# Patient Record
Sex: Female | Born: 1955 | Race: White | Hispanic: No | Marital: Single | Smoking: Never smoker
Health system: Southern US, Community
[De-identification: ages and names within clinical notes are randomized; demographics above are authoritative.]

## PROBLEM LIST (undated history)

## (undated) DIAGNOSIS — E7800 Pure hypercholesterolemia, unspecified: Secondary | ICD-10-CM

## (undated) DIAGNOSIS — Z803 Family history of malignant neoplasm of breast: Secondary | ICD-10-CM

## (undated) DIAGNOSIS — Z800 Family history of malignant neoplasm of digestive organs: Secondary | ICD-10-CM

## (undated) DIAGNOSIS — M8580 Other specified disorders of bone density and structure, unspecified site: Secondary | ICD-10-CM

## (undated) HISTORY — DX: Family history of malignant neoplasm of digestive organs: Z80.0

## (undated) HISTORY — DX: Family history of malignant neoplasm of breast: Z80.3

## (undated) HISTORY — DX: Other specified disorders of bone density and structure, unspecified site: M85.80

---

## 1978-10-04 HISTORY — PX: BREAST BIOPSY: SHX20

## 1988-10-04 HISTORY — PX: OTHER SURGICAL HISTORY: SHX169

## 2002-03-28 ENCOUNTER — Other Ambulatory Visit: Admission: RE | Admit: 2002-03-28 | Discharge: 2002-03-28 | Payer: Self-pay | Admitting: Obstetrics and Gynecology

## 2002-06-15 ENCOUNTER — Ambulatory Visit (HOSPITAL_COMMUNITY): Admission: RE | Admit: 2002-06-15 | Discharge: 2002-06-15 | Payer: Self-pay | Admitting: Obstetrics and Gynecology

## 2002-06-15 ENCOUNTER — Encounter (INDEPENDENT_AMBULATORY_CARE_PROVIDER_SITE_OTHER): Payer: Self-pay | Admitting: *Deleted

## 2003-05-31 ENCOUNTER — Other Ambulatory Visit: Admission: RE | Admit: 2003-05-31 | Discharge: 2003-05-31 | Payer: Self-pay | Admitting: Obstetrics and Gynecology

## 2003-07-16 ENCOUNTER — Emergency Department (HOSPITAL_COMMUNITY): Admission: AD | Admit: 2003-07-16 | Discharge: 2003-07-16 | Payer: Self-pay | Admitting: Emergency Medicine

## 2004-06-24 ENCOUNTER — Encounter: Admission: RE | Admit: 2004-06-24 | Discharge: 2004-06-24 | Payer: Self-pay | Admitting: Obstetrics and Gynecology

## 2004-07-21 ENCOUNTER — Other Ambulatory Visit: Admission: RE | Admit: 2004-07-21 | Discharge: 2004-07-21 | Payer: Self-pay | Admitting: Obstetrics and Gynecology

## 2005-01-06 ENCOUNTER — Other Ambulatory Visit: Admission: RE | Admit: 2005-01-06 | Discharge: 2005-01-06 | Payer: Self-pay | Admitting: Obstetrics and Gynecology

## 2005-03-31 ENCOUNTER — Encounter: Admission: RE | Admit: 2005-03-31 | Discharge: 2005-03-31 | Payer: Self-pay | Admitting: Obstetrics and Gynecology

## 2005-05-24 ENCOUNTER — Ambulatory Visit: Payer: Self-pay | Admitting: Internal Medicine

## 2005-06-11 ENCOUNTER — Ambulatory Visit: Payer: Self-pay | Admitting: Internal Medicine

## 2005-08-02 ENCOUNTER — Other Ambulatory Visit: Admission: RE | Admit: 2005-08-02 | Discharge: 2005-08-02 | Payer: Self-pay | Admitting: Obstetrics and Gynecology

## 2005-11-24 ENCOUNTER — Encounter: Admission: RE | Admit: 2005-11-24 | Discharge: 2005-11-24 | Payer: Self-pay | Admitting: Obstetrics and Gynecology

## 2006-07-04 ENCOUNTER — Encounter: Admission: RE | Admit: 2006-07-04 | Discharge: 2006-07-04 | Payer: Self-pay | Admitting: Obstetrics and Gynecology

## 2007-01-17 ENCOUNTER — Encounter: Admission: RE | Admit: 2007-01-17 | Discharge: 2007-01-17 | Payer: Self-pay | Admitting: Obstetrics and Gynecology

## 2007-12-28 ENCOUNTER — Ambulatory Visit: Payer: Self-pay | Admitting: Cardiology

## 2008-03-05 ENCOUNTER — Encounter: Admission: RE | Admit: 2008-03-05 | Discharge: 2008-03-05 | Payer: Self-pay | Admitting: Obstetrics and Gynecology

## 2009-04-21 ENCOUNTER — Encounter: Admission: RE | Admit: 2009-04-21 | Discharge: 2009-04-21 | Payer: Self-pay | Admitting: Obstetrics and Gynecology

## 2010-04-30 ENCOUNTER — Encounter: Admission: RE | Admit: 2010-04-30 | Discharge: 2010-04-30 | Payer: Self-pay | Admitting: Obstetrics and Gynecology

## 2010-05-11 ENCOUNTER — Encounter (INDEPENDENT_AMBULATORY_CARE_PROVIDER_SITE_OTHER): Payer: Self-pay | Admitting: *Deleted

## 2010-09-09 ENCOUNTER — Encounter (INDEPENDENT_AMBULATORY_CARE_PROVIDER_SITE_OTHER): Payer: Self-pay | Admitting: *Deleted

## 2010-09-12 ENCOUNTER — Emergency Department (HOSPITAL_COMMUNITY)
Admission: EM | Admit: 2010-09-12 | Discharge: 2010-09-12 | Payer: Self-pay | Source: Home / Self Care | Admitting: Emergency Medicine

## 2010-10-07 ENCOUNTER — Encounter (INDEPENDENT_AMBULATORY_CARE_PROVIDER_SITE_OTHER): Payer: Self-pay | Admitting: *Deleted

## 2010-10-09 ENCOUNTER — Ambulatory Visit
Admission: RE | Admit: 2010-10-09 | Discharge: 2010-10-09 | Payer: Self-pay | Source: Home / Self Care | Attending: Internal Medicine | Admitting: Internal Medicine

## 2010-10-29 ENCOUNTER — Ambulatory Visit
Admission: RE | Admit: 2010-10-29 | Discharge: 2010-10-29 | Payer: Self-pay | Source: Home / Self Care | Attending: Internal Medicine | Admitting: Internal Medicine

## 2010-10-29 ENCOUNTER — Encounter: Payer: Self-pay | Admitting: Internal Medicine

## 2010-11-05 NOTE — Letter (Signed)
Summary: Miralax Instructions  [facility]  [address]   [place], [place] [address]   Phone: [phone]  Fax: [phone]       Amanda Wolf    26-Jun-1956    MRN: [id number]       Procedure Day Dorna Bloom: Thursday, 10-29-10     Arrival Time: 9:00 a.m.     Procedure Time: 10:00 a.m.     Location of Procedure:                    x   [facility] (4th Floor)   PREPARATION FOR COLONOSCOPY WITH MIRALAX  Starting 5 days prior to your procedure 10-24-10 do not eat nuts, seeds, popcorn, corn, beans, peas,  salads, or any raw vegetables.  Do not take any fiber supplements (e.g. Metamucil, Citrucel, and Benefiber). ____________________________________________________________________________________________________   THE DAY BEFORE YOUR PROCEDURE         DATE: 10-28-10  DAY: Wednesday  1   Drink clear liquids the entire day-NO SOLID FOOD  2   Do not drink anything colored red or purple.  Avoid juices with pulp.  No orange juice.  3   Drink at least 64 oz. (8 glasses) of fluid/clear liquids during the day to prevent dehydration and help the prep work efficiently.  CLEAR LIQUIDS INCLUDE: Water Jello Ice Popsicles Tea (sugar ok, no milk/cream) Powdered fruit flavored drinks Coffee (sugar ok, no milk/cream) Gatorade Juice: apple, white grape, white cranberry  Lemonade Clear bullion, consomm, broth Carbonated beverages (any kind) Strained chicken noodle soup Hard Candy  4   Mix the entire bottle of Miralax with 64 oz. of Gatorade/Powerade in the morning and put in the refrigerator to chill.  5   At 3:00 pm take 2 Dulcolax/Bisacodyl tablets.  6   At 4:30 pm take one Reglan/Metoclopramide tablet.  7  Starting at 5:00 pm drink one 8 oz glass of the Miralax mixture every 15-20 minutes until you have finished drinking the entire 64 oz.  You should finish drinking prep around 7:30 or 8:00 pm.  8   If you are nauseated, you may take the 2nd  Reglan/Metoclopramide tablet at 6:30 pm.        9    At 8:00 pm take 2 more DULCOLAX/Bisacodyl tablets.     THE DAY OF YOUR PROCEDURE      DATE:  10-29-10  DAY: Thursday  You may drink clear liquids until 8:00 a.m. (2 HOURS BEFORE PROCEDURE).   MEDICATION INSTRUCTIONS  Unless otherwise instructed, you should take regular prescription medications with a small sip of water as early as possible the morning of your procedure.           OTHER INSTRUCTIONS  You will need a responsible adult at least 53 years of age to accompany you and drive you home.   This person must remain in the waiting room during your procedure.  Wear loose fitting clothing that is easily removed.  Leave jewelry and other valuables at home.  However, you may wish to bring a book to read or an iPod/MP3 player to listen to music as you wait for your procedure to start.  Remove all body piercing jewelry and leave at home.  Total time from sign-in until discharge is approximately 2-3 hours.  You should go home directly after your procedure and rest.  You can resume normal activities the day after your procedure.  The day of your procedure you should not:   Drive  Make legal decisions   Operate machinery   Drink alcohol   Return to work  You will receive specific instructions about eating, activities and medications before you leave.   The above instructions have been reviewed and explained to me by   Ezra Sites RN  October 09, 2010 11:17 AM    I fully understand and can verbalize these instructions _____________________________ Date _______

## 2010-11-05 NOTE — Procedures (Signed)
Summary: Colonoscopy  Patient: Amanda Wolf Note: All result statuses are Final unless otherwise noted.  Tests: (1) Colonoscopy (COL)   COL Colonoscopy           DONE (C)     [facility]     [address]     [place], [place]  [address]           COLONOSCOPY PROCEDURE REPORT           PATIENT:  Amanda Wolf, Amanda Wolf  MR#:  [id number]     BIRTHDATE:  Jul 19, 1956, 54 yrs. old  GENDER:  female     ENDOSCOPIST:  Hedwig Morton. Juanda Chance, MD     REF. BY:  Laurann Montana, M.D.     PROCEDURE DATE:  10/29/2010     PROCEDURE:  Colonoscopy 29562     ASA CLASS:  Class I     INDICATIONS:  family history of colon cancer mother with colon     cancer at 66     last colon 2001 and 2006     MEDICATIONS:   Versed 10 mg, Fentanyl 100 mcg           DESCRIPTION OF PROCEDURE:   After the risks benefits and     alternatives of the procedure were thoroughly explained, informed     consent was obtained.  Digital rectal exam was performed and     revealed no rectal masses.   The LB 180AL [id number] endoscope was     introduced through the anus and advanced to the cecum, which was     identified by both the appendix and ileocecal valve, without     limitations.  The quality of the prep was good, using MiraLax.     The instrument was then slowly withdrawn as the colon was fully     examined.     <<PROCEDUREIMAGES>>           FINDINGS:  No polyps or cancers were seen (see image1, image2,     image3, image4, image5, and image6).   Retroflexed views in the     rectum revealed no abnormalities.    The scope was then withdrawn     from the patient and the procedure completed.           COMPLICATIONS:  None     ENDOSCOPIC IMPRESSION:     1) No polyps or cancers     2) Normal colonoscopy     RECOMMENDATIONS:     1) high fiber diet     REPEAT EXAM:  In 5 year(s) for.           ______________________________     Hedwig Morton. Juanda Chance, MD           CC:           n.     REVISED:  10/29/2010 11:36 AM     eSIGNED:    Hedwig Morton. Lanae Federer at 10/29/2010 11:36 AM           Kristian Covey, [id number]  Note: An exclamation mark (!) indicates a result that was not dispersed into the flowsheet. Document Creation Date: 10/29/2010 11:36 AM _______________________________________________________________________  (1) Order result status: Final Collection or observation date-time: 10/29/2010 10:51 Requested date-time:  Receipt date-time:  Reported date-time:  Referring Physician:   Ordering Physician: Lina Sar [id number]) Specimen Source:  Source: Launa Grill Order Number: [id number] Lab site:   Appended Document: Colonoscopy    Clinical Lists Changes  Observations: Added  new observation of COLONNXTDUE: 10/2015 (10/29/2010 16:18)

## 2010-11-05 NOTE — Letter (Signed)
Summary: Pre Visit Letter Revised  [facility]  [address] [place], [place] [address]   Phone: [phone]  Fax: [phone]        09/09/2010 MRN: [id number] [facility] [address] [place], [place]  [address]             Procedure Date:  10-23-10   Welcome to the Gastroenterology Division at [facility].    You are scheduled to see a nurse for your pre-procedure visit on 10-09-10 at 11:00a.m. on the 3rd floor at [facility], [address]  We ask that you try to arrive at our office 15 minutes prior to your appointment time to allow for check-in.  Please take a minute to review the attached form.  If you answer "Yes" to one or more of the questions on the first page, we ask that you call the person listed at your earliest opportunity.  If you answer "No" to all of the questions, please complete the rest of the form and bring it to your appointment.    Your nurse visit will consist of discussing your medical and surgical history, your immediate family medical history, and your medications.   If you are unable to list all of your medications on the form, please bring the medication bottles to your appointment and we will list them.  We will need to be aware of both prescribed and over the counter drugs.  We will need to know exact dosage information as well.    Please be prepared to read and sign documents such as consent forms, a financial agreement, and acknowledgement forms.  If necessary, and with your consent, a friend or relative is welcome to sit-in on the nurse visit with you.  Please bring your insurance card so that we may make a copy of it.  If your insurance requires a referral to see a specialist, please bring your referral form from your primary care physician.  No co-pay is required for this nurse visit.     If you cannot keep your appointment, please call [phone] to cancel or reschedule prior to your appointment date.  This allows [place]  the opportunity to schedule an appointment for another patient in need of care.    Thank you for choosing [facility] for your medical needs.  We appreciate the opportunity to care for you.  Please visit [place] at our website  to learn more about our practice.  Sincerely, The Gastroenterology Division

## 2010-11-05 NOTE — Letter (Signed)
Summary: Colonoscopy Letter  [facility]  [address] [place], [place] [address]   Phone: [phone]  Fax: [phone]      May 11, 2010 MRN: [id number]   [facility] [address] [place], [place]  [address]   Dear Ms. Lounsbury,   According to your medical record, it is time for you to schedule a Colonoscopy. The American Cancer Society recommends this procedure as a method to detect early colon cancer. Patients with a family history of colon cancer, or a personal history of colon polyps or inflammatory bowel disease are at increased risk.  This letter has been generated based on the recommendations made at the time of your procedure. If you feel that in your particular situation this may no longer apply, please contact our office.  Please call our office at [phone] to schedule this appointment or to update your records at your earliest convenience.  Thank you for cooperating with [place] to provide you with the very best care possible.   Sincerely,  Hedwig Morton. Juanda Chance, M.D.  [facility] Gastroenterology Division [phone]

## 2010-11-05 NOTE — Miscellaneous (Signed)
Summary: [facility]  Clinical Lists Changes  Medications: Added new medication of MIRALAX   POWD (POLYETHYLENE GLYCOL 3350) As per prep  instructions. - Signed Added new medication of DULCOLAX 5 MG  TBEC (BISACODYL) Day before procedure take 2 at 3pm and 2 at 8pm. - Signed Added new medication of REGLAN 10 MG  TABS (METOCLOPRAMIDE HCL) As per prep instructions. - Signed Rx of MIRALAX   POWD (POLYETHYLENE GLYCOL 3350) As per prep  instructions.;  #255gm x 0;  Signed;  Entered by: Ezra Sites RN;  Authorized by: Hart Carwin MD;  Method used: Electronically to [facility]  [address] # [id number]*, [address] [place], [place], [place]  [address], Ph: [phone] or [phone], Fax: [phone] Rx of DULCOLAX 5 MG  TBEC (BISACODYL) Day before procedure take 2 at 3pm and 2 at 8pm.;  #4 x 0;  Signed;  Entered by: Ezra Sites RN;  Authorized by: Hart Carwin MD;  Method used: Electronically to [facility]  [address] # [id number]*, [address] [place], [place], [place]  [address], Ph: [phone] or [phone], Fax: [phone] Rx of REGLAN 10 MG  TABS (METOCLOPRAMIDE HCL) As per prep instructions.;  #2 x 0;  Signed;  Entered by: Ezra Sites RN;  Authorized by: Hart Carwin MD;  Method used: Electronically to [facility]  [address] # [id number]*, [address] [place], [place], [place]  [address], Ph: [phone] or [phone], Fax: [phone] Allergies: Added new allergy or adverse reaction of INDOCIN Added new allergy or adverse reaction of SULFA Observations: Added new observation of NKA: F (10/09/2010 10:53)    Prescriptions: REGLAN 10 MG  TABS (METOCLOPRAMIDE HCL) As per prep instructions.  #2 x 0   Entered by:   Ezra Sites RN   Authorized by:   Hart Carwin MD   Signed by:   Ezra Sites RN on 10/09/2010   Method used:   Electronically to        [facility] [id number] (retail)       [address]       [place], [place]  [address]       Ph:  [phone] or [phone]       Fax: [phone]   RxID:   [id number] DULCOLAX 5 MG  TBEC (BISACODYL) Day before procedure take 2 at 3pm and 2 at 8pm.  #4 x 0   Entered by:   Ezra Sites RN   Authorized by:   Hart Carwin MD   Signed by:   Ezra Sites RN on 10/09/2010   Method used:   Electronically to        [facility] [address] [id number]* (retail)       [address]       [place], [place]  [address]       Ph: [phone] or [phone]       Fax: [phone]   RxID:   [id number] MIRALAX   POWD (POLYETHYLENE GLYCOL 3350) As per prep  instructions.  #255gm x 0   Entered by:   Ezra Sites RN   Authorized by:   Hart Carwin MD   Signed by:   Ezra Sites RN on 10/09/2010   Method used:   Electronically to        [facility] # [id number]* (retail)  [address]       [place], [place]  [address]       Ph: [phone] or [phone]       Fax: [phone]   RxID:   [id number]

## 2010-12-15 LAB — BASIC METABOLIC PANEL
BUN: 14 mg/dL (ref 6–23)
CO2: 21 mEq/L (ref 19–32)
Calcium: 9.1 mg/dL (ref 8.4–10.5)
Chloride: 109 mEq/L (ref 96–112)
Creatinine, Ser: 0.88 mg/dL (ref 0.4–1.2)
GFR calc Af Amer: >60 mL/min (ref >60)
GFR calc non Af Amer: >60 mL/min (ref >60)
Glucose, Bld: 94 mg/dL (ref 70–99)
Potassium: 3.8 mEq/L (ref 3.5–5.1)
Sodium: 141 mEq/L (ref 135–145)

## 2010-12-15 LAB — CBC
HCT: 35.9 % — ABNORMAL LOW (ref 36.0–46.0)
Hemoglobin: 12.3 g/dL (ref 12.0–15.0)
MCH: 32.2 pg (ref 26.0–34.0)
RBC: 3.82 MIL/uL — ABNORMAL LOW (ref 3.87–5.11)

## 2010-12-15 LAB — DIFFERENTIAL
Basophils Absolute: 0 10*3/uL (ref 0.0–0.1)
Basophils Relative: 0 % (ref 0–1)
Eosinophils Absolute: 0.1 10*3/uL (ref 0.0–0.7)
Eosinophils Relative: 1 % (ref 0–5)
Lymphocytes Relative: 34 % (ref 12–46)
Lymphs Abs: 1.7 10*3/uL (ref 0.7–4.0)
Monocytes Absolute: 0.4 10*3/uL (ref 0.1–1.0)
Monocytes Relative: 7 % (ref 3–12)
Neutro Abs: 2.9 10*3/uL (ref 1.7–7.7)
Neutrophils Relative %: 57 % (ref 43–77)

## 2010-12-15 LAB — URINALYSIS, ROUTINE W REFLEX MICROSCOPIC
Bilirubin Urine: NEGATIVE
Glucose, UA: NEGATIVE mg/dL
Hgb urine dipstick: NEGATIVE
Ketones, ur: NEGATIVE mg/dL
Nitrite: NEGATIVE
Protein, ur: NEGATIVE mg/dL
Specific Gravity, Urine: 1.024 (ref 1.005–1.030)
Urobilinogen, UA: 0.2 mg/dL (ref 0.0–1.0)
pH: 5.5 (ref 5.0–8.0)

## 2010-12-15 LAB — HEPATIC FUNCTION PANEL
AST: 32 U/L (ref 0–37)
Albumin: 3.7 g/dL (ref 3.5–5.2)
Alkaline Phosphatase: 50 U/L (ref 39–117)
Bilirubin, Direct: <0.1 mg/dL (ref 0.0–0.3)
Total Bilirubin: 0.3 mg/dL (ref 0.3–1.2)

## 2010-12-15 LAB — POCT CARDIAC MARKERS
CKMB, poc: <1 ng/mL — ABNORMAL LOW (ref 1.0–8.0)
Myoglobin, poc: 64 ng/mL (ref 12–200)
Troponin i, poc: <0.05 ng/mL (ref 0.00–0.09)

## 2010-12-15 LAB — LIPASE, BLOOD: Lipase: 29 U/L (ref 11–59)

## 2011-02-16 NOTE — Assessment & Plan Note (Signed)
[facility]                            CARDIOLOGY OFFICE NOTE   NAME:Amanda Wolf, KEYLEIGH MANNINEN                    MRN:          [id number]  DATE:12/28/2007                            DOB:          [birth year]    I was asked by Dr. Marcelle Overlie to consult on Santiago Graf. Andujo for  cardiovascular screen as well as for some chest discomfort.   She is an extremely health-conscious 55 year old single white female.  She has had some chest discomfort in the upper center part of her chest  that was fairly well localized.  This is spontaneous and not exertion  related.  It does not radiate to her neck or down her arm.  It is not  associated with shortness of breath.   She denies any pleuritic chest pain.  She has had no chest trauma.  She  has had no difficulty swallowing foods or liquids.   Her cardiac risk factors are minimum at the present time.  I do not have  a copy of her lipids, but she says these are always good with Dr. Tawanna Cooler.  In addition, she does not smoke, has no history of hypertension, walks  around 2-3 hours a week, is not overweight, is not diabetic, has no  premature family history, and does not use any recreational products.   ALLERGIES:  SHE IS INTOLERANT OF INDOCIN AND SOME SULFA DRUGS.  SHE DOES  NOT HAVE A DYE ALLERGY OR SHELLFISH ALLERGY.   SOCIAL HISTORY:  She does on occasion have some alcohol socially.  She  does not drink caffeinated beverages.   PAST MEDICAL HISTORY:  She has a history of migraine headaches.   PAST MEDICAL HISTORY:  1. 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  2. Removal of ovarian cyst as well.   FAMILY HISTORY:  Really negative for coronary disease, premature heart  disease.   MEDICATIONS:  She is currently on no medications.   OCCUPATION:  She works at [employer].  She is in Estate agent.   REVIEW OF SYSTEMS:  Negative.  All systems reviewed and questioned.   PHYSICAL  EXAMINATION:  VITAL SIGNS:  Blood pressure is 111/65, her pulse  59 and regular.  Weight is 133.  She is 5 feet 4.  HEENT:  Normocephalic, atraumatic.  PERRLA.  Extraocular movements  intact.  Sclerae are clear.  Facial symmetry is normal.  NECK:  Carotids were equal bilaterally without bruits.  Thyroid is  normal.  Trachea is midline.  Neck is supple.  LUNGS:  Clear to auscultation and percussion.  There is no rub or  adventitial sounds.  HEART:  A normal S1-S2 without murmur, rub or click.  There is no  displacement of her PMI.  ABDOMEN:  Soft, good bowel sounds.  No midline bruit.  No hepatomegaly  and no organomegaly.  No tenderness.  EXTREMITIES:  No cyanosis, clubbing or edema.  There is no sign of DVT.  Pulses are brisk.  NEUROLOGIC:  Intact.  SKIN:  Unremarkable, except for freckling.   Her EKG is completely  normal, except for some sinus bradycardia, rate  59.  She has an RSR prime at V1 which is probably within normal limits.  PR, QRS and QTCs are normal.   ASSESSMENT AND PLAN:  1. Noncardiac chest pain.  2. No significant risk factors for coronary atherosclerosis or      premature vascular disease.  3. Healthy lifestyle.   RECOMMENDATIONS:  I have reassured her that I do not think this is  coronary related.  We have gone over symptoms of angina and acute  coronary syndrome and how to respond and also activate 9-1-1 in time  dependent fashion.  I have asked her to increase her walking and cardio  activities to greater than 3 hours a week.  She will continue her other  healthy habits.  We will see her back on a p.r.n. basis.     Thomas C. Daleen Squibb, MD, [facility]  Electronically Signed    TCW/MedQ  DD: 12/28/2007  DT: 12/29/2007  Job #: [id number]   cc:   Marcelino Duster L. Vincente Poli, M.D.

## 2011-02-19 NOTE — Op Note (Signed)
   NAME:  Amanda Wolf, Amanda Wolf                       ACCOUNT NO.:  [id number]   MEDICAL RECORD NO.:  [id number]                   PATIENT TYPE:  AMB   LOCATION:  SDC                                  FACILITY:  [facility]   PHYSICIAN:  Michelle L. Vincente Poli, M.D.            DATE OF BIRTH:  07/10/1956   DATE OF PROCEDURE:  06/15/2002  DATE OF DISCHARGE:                                 OPERATIVE REPORT   PREOPERATIVE DIAGNOSES:  Endometrial polyp.   POSTOPERATIVE DIAGNOSES:  Endometrial polyp.   PROCEDURE:  Dilatation and curettage and diagnostic hysteroscopy.   SURGEON:  Michelle L. Vincente Poli, M.D.   ANESTHESIA:  MAC with paracervical block.   PROCEDURE:  The patient is taken to the operating room.  She was given  sedation.  She was then placed in the lithotomy position.  The bladder was  emptied using an in-and-out catheter.  A speculum was inserted into the  vagina.  The cervix was grasped with a tenaculum and a paracervical block  was then performed.  A Pratt dilator was used to dilate the cervical  internal os and a diagnostic hysteroscope was inserted into the uterus.  There was a small endometrial polyp seen coming from the right lateral wall  of the uterus.  A curette was inserted into the uterus and the polyp was  removed in its entirety.  Hysteroscope was inserted one final time into the  uterine cavity and the polyp was noted to be gone.  All sponge, lap, and  instrument counts were correct x2.  All instruments were removed from the  vagina and the patient tolerated the procedure well.                                               Michelle L. Vincente Poli, M.D.    Florestine Avers  D:  06/15/2002  T:  06/15/2002  Job:  [id number]

## 2011-04-29 ENCOUNTER — Other Ambulatory Visit: Payer: Self-pay | Admitting: Obstetrics and Gynecology

## 2011-04-29 DIAGNOSIS — Z1231 Encounter for screening mammogram for malignant neoplasm of breast: Secondary | ICD-10-CM

## 2011-05-11 ENCOUNTER — Ambulatory Visit
Admission: RE | Admit: 2011-05-11 | Discharge: 2011-05-11 | Disposition: A | Discharge status: Home / Self Care | Payer: BC Managed Care – PPO | Source: Ambulatory Visit | Attending: Obstetrics and Gynecology | Admitting: Obstetrics and Gynecology

## 2011-05-11 DIAGNOSIS — Z1231 Encounter for screening mammogram for malignant neoplasm of breast: Secondary | ICD-10-CM

## 2012-06-22 ENCOUNTER — Other Ambulatory Visit: Payer: Self-pay | Admitting: Obstetrics and Gynecology

## 2012-10-23 ENCOUNTER — Other Ambulatory Visit: Payer: Self-pay | Admitting: Obstetrics and Gynecology

## 2012-10-23 DIAGNOSIS — Z1231 Encounter for screening mammogram for malignant neoplasm of breast: Secondary | ICD-10-CM

## 2012-11-23 ENCOUNTER — Ambulatory Visit
Admission: RE | Admit: 2012-11-23 | Discharge: 2012-11-23 | Disposition: A | Discharge status: Home / Self Care | Payer: PRIVATE HEALTH INSURANCE | Source: Ambulatory Visit | Attending: Obstetrics and Gynecology | Admitting: Obstetrics and Gynecology

## 2012-11-23 DIAGNOSIS — Z1231 Encounter for screening mammogram for malignant neoplasm of breast: Secondary | ICD-10-CM

## 2013-03-30 ENCOUNTER — Other Ambulatory Visit: Payer: Self-pay | Admitting: Obstetrics and Gynecology

## 2014-04-18 ENCOUNTER — Other Ambulatory Visit: Payer: Self-pay

## 2014-04-18 DIAGNOSIS — Z1231 Encounter for screening mammogram for malignant neoplasm of breast: Secondary | ICD-10-CM

## 2014-04-23 ENCOUNTER — Ambulatory Visit
Admission: RE | Admit: 2014-04-23 | Discharge: 2014-04-23 | Disposition: A | Discharge status: Home / Self Care | Payer: PRIVATE HEALTH INSURANCE | Source: Ambulatory Visit

## 2014-04-23 DIAGNOSIS — Z1231 Encounter for screening mammogram for malignant neoplasm of breast: Secondary | ICD-10-CM

## 2014-05-01 ENCOUNTER — Other Ambulatory Visit: Payer: Self-pay | Admitting: Obstetrics and Gynecology

## 2014-05-02 LAB — CYTOLOGY - PAP

## 2015-02-10 ENCOUNTER — Encounter: Payer: Self-pay | Admitting: Internal Medicine

## 2015-04-10 ENCOUNTER — Other Ambulatory Visit: Payer: Self-pay

## 2015-04-10 DIAGNOSIS — Z1231 Encounter for screening mammogram for malignant neoplasm of breast: Secondary | ICD-10-CM

## 2015-04-25 ENCOUNTER — Ambulatory Visit
Admission: RE | Admit: 2015-04-25 | Discharge: 2015-04-25 | Disposition: A | Discharge status: Home / Self Care | Payer: PRIVATE HEALTH INSURANCE | Source: Ambulatory Visit

## 2015-04-25 DIAGNOSIS — Z1231 Encounter for screening mammogram for malignant neoplasm of breast: Secondary | ICD-10-CM

## 2015-06-18 ENCOUNTER — Other Ambulatory Visit: Payer: Self-pay | Admitting: Obstetrics and Gynecology

## 2015-06-19 LAB — CYTOLOGY - PAP

## 2015-11-28 ENCOUNTER — Encounter: Payer: Self-pay | Admitting: Gastroenterology

## 2016-01-08 ENCOUNTER — Encounter: Payer: Self-pay | Admitting: Gastroenterology

## 2016-02-27 ENCOUNTER — Ambulatory Visit (AMBULATORY_SURGERY_CENTER): Payer: Self-pay | Admitting: *Deleted

## 2016-02-27 VITALS — Ht 64.0 in | Wt 129.6 lb

## 2016-02-27 DIAGNOSIS — Z800 Family history of malignant neoplasm of digestive organs: Secondary | ICD-10-CM

## 2016-02-27 MED ORDER — NA SULFATE-K SULFATE-MG SULF 17.5-3.13-1.6 GM/177ML PO SOLN: ORAL | Status: DC

## 2016-02-27 NOTE — Progress Notes (Signed)
No allergies to eggs or soy. No problems with anesthesia.  Pt given Emmi instructions for colonoscopy  No oxygen use  No diet drug use  

## 2016-03-02 ENCOUNTER — Encounter: Payer: Self-pay | Admitting: Gastroenterology

## 2016-03-12 ENCOUNTER — Ambulatory Visit (AMBULATORY_SURGERY_CENTER): Payer: BLUE CROSS/BLUE SHIELD | Admitting: Gastroenterology

## 2016-03-12 ENCOUNTER — Encounter: Payer: Self-pay | Admitting: Gastroenterology

## 2016-03-12 VITALS — BP 123/78 | HR 63 | Temp 97.7°F | Resp 13 | Ht 64.0 in | Wt 129.0 lb

## 2016-03-12 DIAGNOSIS — Z1211 Encounter for screening for malignant neoplasm of colon: Secondary | ICD-10-CM

## 2016-03-12 DIAGNOSIS — Z800 Family history of malignant neoplasm of digestive organs: Secondary | ICD-10-CM | POA: Diagnosis not present

## 2016-03-12 MED ORDER — SODIUM CHLORIDE 0.9 % IV SOLN: 500.0000 mL | INTRAVENOUS | Status: DC

## 2016-03-12 NOTE — Patient Instructions (Signed)
YOU HAD AN ENDOSCOPIC PROCEDURE TODAY AT [facility]:   Refer to the procedure report that was given to you for any specific questions about what was found during the examination.  If the procedure report does not answer your questions, please call your gastroenterologist to clarify.  If you requested that your care partner not be given the details of your procedure findings, then the procedure report has been included in a sealed envelope for you to review at your convenience later.  YOU SHOULD EXPECT: Some feelings of bloating in the abdomen. Passage of more gas than usual.  Walking can help get rid of the air that was put into your GI tract during the procedure and reduce the bloating. If you had a lower endoscopy (such as a colonoscopy or flexible sigmoidoscopy) you may notice spotting of blood in your stool or on the toilet paper. If you underwent a bowel prep for your procedure, you may not have a normal bowel movement for a few days.  Please Note:  You might notice some irritation and congestion in your nose or some drainage.  This is from the oxygen used during your procedure.  There is no need for concern and it should clear up in a day or so.  SYMPTOMS TO REPORT IMMEDIATELY:   Following lower endoscopy (colonoscopy or flexible sigmoidoscopy):  Excessive amounts of blood in the stool  Significant tenderness or worsening of abdominal pains  Swelling of the abdomen that is new, acute  Fever of 100F or higher   For urgent or emergent issues, a gastroenterologist can be reached at any hour by calling [phone].   DIET: Your first meal following the procedure should be a small meal and then it is ok to progress to your normal diet. Heavy or fried foods are harder to digest and may make you feel nauseous or bloated.  Likewise, meals heavy in dairy and vegetables can increase bloating.  Drink plenty of fluids but you should avoid alcoholic beverages for 24  hours.  ACTIVITY:  You should plan to take it easy for the rest of today and you should NOT DRIVE or use heavy machinery until tomorrow (because of the sedation medicines used during the test).    FOLLOW UP: Our staff will call the number listed on your records the next business day following your procedure to check on you and address any questions or concerns that you may have regarding the information given to you following your procedure. If we do not reach you, we will leave a message.  However, if you are feeling well and you are not experiencing any problems, there is no need to return our call.  We will assume that you have returned to your regular daily activities without incident.  If any biopsies were taken you will be contacted by phone or by letter within the next 1-3 weeks.  Please call us at [phone] if you have not heard about the biopsies in 3 weeks.    SIGNATURES/CONFIDENTIALITY: You and/or your care partner have signed paperwork which will be entered into your electronic medical record.  These signatures attest to the fact that that the information above on your After Visit Summary has been reviewed and is understood.  Full responsibility of the confidentiality of this discharge information lies with you and/or your care-partner.   Information on diverticulosis ,hemorrhoids ,and high fiber diet given to you today

## 2016-03-12 NOTE — Op Note (Signed)
[place] Patient Name: Amanda Wolf Procedure Date: 03/12/2016 10:09 AM MRN: [id number] Endoscopist: Mauri Pole , MD Age: 59 Referring MD:  Date of Birth: 09/15/1956 Gender: Female Procedure:                Colonoscopy Indications:              Screening in patient at increased risk: Colorectal                            cancer in mother 75 or older Medicines:                Monitored Anesthesia Care Procedure:                Pre-Anesthesia Assessment:                           - Prior to the procedure, a History and Physical                            was performed, and patient medications and                            allergies were reviewed. The patient's tolerance of                            previous anesthesia was also reviewed. The risks                            and benefits of the procedure and the sedation                            options and risks were discussed with the patient.                            All questions were answered, and informed consent                            was obtained. Prior Anticoagulants: The patient has                            taken no previous anticoagulant or antiplatelet                            agents. ASA Grade Assessment: I - A normal, healthy                            patient. After reviewing the risks and benefits,                            the patient was deemed in satisfactory condition to                            undergo the procedure.  After obtaining informed consent, the colonoscope                            was passed under direct vision. Throughout the                            procedure, the patient's blood pressure, pulse, and                            oxygen saturations were monitored continuously. The                            Model CF-HQ190L [id number]) scope was introduced                            through the anus and advanced to the the cecum,                             identified by appendiceal orifice and ileocecal                            valve. The colonoscopy was performed without                            difficulty. The patient tolerated the procedure                            well. The quality of the bowel preparation was                            good. The ileocecal valve, appendiceal orifice, and                            rectum were photographed. Scope In: 10:12:36 AM Scope Out: 10:22:45 AM Scope Withdrawal Time: 0 hours 4 minutes 46 seconds  Total Procedure Duration: 0 hours 10 minutes 9 seconds  Findings:                 The perianal and digital rectal examinations were                            normal.                           A few small-mouthed diverticula were found in the                            sigmoid colon.                           Non-bleeding internal hemorrhoids were found during                            retroflexion. The hemorrhoids were small. Complications:            No immediate complications. Estimated Blood Loss:  Estimated blood loss: none. Impression:               - Diverticulosis in the sigmoid colon.                           - Non-bleeding internal hemorrhoids.                           - No specimens collected. Recommendation:           - Patient has a contact number available for                            emergencies. The signs and symptoms of potential                            delayed complications were discussed with the                            patient. Return to normal activities tomorrow.                            Written discharge instructions were provided to the                            patient.                           - Resume previous diet.                           - Continue present medications.                           - Repeat colonoscopy in 5 years for screening                            purposes.                           - Return to GI clinic  PRN. Mauri Pole, MD 03/12/2016 10:31:38 AM This report has been signed electronically.

## 2016-03-12 NOTE — Progress Notes (Signed)
Report to PACU, RN, vss, BBS= Clear.  

## 2016-03-15 ENCOUNTER — Telehealth: Payer: Self-pay | Admitting: *Deleted

## 2016-03-15 NOTE — Telephone Encounter (Signed)
  Follow up Call-  Call back number 03/12/2016  Post procedure Call Back phone  # [phone]  Permission to leave phone message Yes     Patient questions:  Do you have a fever, pain , or abdominal swelling? No. Pain Score  0 *  Have you tolerated food without any problems? Yes.    Have you been able to return to your normal activities? Yes.    Do you have any questions about your discharge instructions: Diet   No. Medications  No. Follow up visit  No.  Do you have questions or concerns about your Care? No.  Actions: * If pain score is 4 or above: No action needed, pain <4.

## 2016-05-06 ENCOUNTER — Other Ambulatory Visit: Payer: Self-pay | Admitting: Obstetrics and Gynecology

## 2016-05-06 DIAGNOSIS — Z1231 Encounter for screening mammogram for malignant neoplasm of breast: Secondary | ICD-10-CM

## 2016-05-17 ENCOUNTER — Ambulatory Visit
Admission: RE | Admit: 2016-05-17 | Discharge: 2016-05-17 | Disposition: A | Discharge status: Home / Self Care | Payer: BLUE CROSS/BLUE SHIELD | Source: Ambulatory Visit | Attending: Obstetrics and Gynecology | Admitting: Obstetrics and Gynecology

## 2016-05-17 DIAGNOSIS — Z1231 Encounter for screening mammogram for malignant neoplasm of breast: Secondary | ICD-10-CM

## 2016-12-27 ENCOUNTER — Ambulatory Visit (INDEPENDENT_AMBULATORY_CARE_PROVIDER_SITE_OTHER): Payer: BLUE CROSS/BLUE SHIELD | Admitting: Family Medicine

## 2016-12-27 ENCOUNTER — Encounter: Payer: Self-pay | Admitting: Family Medicine

## 2016-12-27 ENCOUNTER — Encounter (INDEPENDENT_AMBULATORY_CARE_PROVIDER_SITE_OTHER): Payer: Self-pay

## 2016-12-27 DIAGNOSIS — S3992XA Unspecified injury of lower back, initial encounter: Secondary | ICD-10-CM

## 2016-12-27 MED ORDER — HYDROCODONE-ACETAMINOPHEN 5-325 MG PO TABS: 1.0000 | ORAL_TABLET | Freq: Four times a day (QID) | ORAL | 0 refills | Status: DC | PRN

## 2016-12-27 MED ORDER — DICLOFENAC SODIUM 75 MG PO TBEC: 75.0000 mg | DELAYED_RELEASE_TABLET | Freq: Two times a day (BID) | ORAL | 1 refills | Status: DC

## 2016-12-27 MED ORDER — METHOCARBAMOL 500 MG PO TABS: 500.0000 mg | ORAL_TABLET | Freq: Three times a day (TID) | ORAL | 1 refills | Status: DC | PRN

## 2016-12-27 NOTE — Patient Instructions (Signed)
You have a lumbar strain, less likely a small disc herniation to the right side. Both are treated similarly. A prednisone dose pack is the best option for immediate relief and may be prescribed. Take diclofenac 75 mg twice a day with food for pain and inflammation. Hydrocodone as needed for severe pain (no driving on this medicine). Robaxin as needed for muscle spasms (try this tonight - if it makes you sleepy then don't drive on this either). Stay as active as possible. Physical therapy has been shown to be helpful as well but would wait on this for now. See handout for home exercises and stretches to do daily as tolerated. Strengthening of low back muscles, abdominal musculature are key for long term pain relief. If not improving, will consider further imaging (MRI). Call me if you're struggling in 1-2 weeks otherwise follow-up typically in 4 weeks.

## 2016-12-29 DIAGNOSIS — S3992XD Unspecified injury of lower back, subsequent encounter: Secondary | ICD-10-CM | POA: Insufficient documentation

## 2016-12-29 NOTE — Assessment & Plan Note (Signed)
consistent with lumbar strain, less likely a small disc herniation.  Will start with diclofenac with norco and robaxin if needed.  Consider physical therapy, prednisone, MRI if not improving as expected.  F/u in 4 weeks but call us in 1-2 weeks if not improving.

## 2016-12-29 NOTE — Progress Notes (Signed)
PCP: No PCP Per Patient  Subjective:   HPI: Patient is a 61 y.o. female here for low back pain.  Patient reports she's had about 1 week of right sided low back pain. Paint reports she was trying to move the mattress when changing sheets and felt a pull in right side of low back. Didn't start to get radiation into right leg until yesterday. No numbness or tingling. Pain with sitting, standing, and lying down. Taking ibuprofen and using lidocaine patches. Pain level is 5/10, sharp right low back. No bowel/bladder dysfunction.  Past Medical History:  Diagnosis Date  . Osteopenia     Current Outpatient Prescriptions on File Prior to Visit  Medication Sig Dispense Refill  . CINNAMON PO Take by mouth daily.    Marland Kitchen EVENING PRIMROSE OIL PO Take by mouth daily.    . Flaxseed, Linseed, (FLAXSEED OIL PO) Take by mouth daily.    Marland Kitchen MAGNESIUM PO Take by mouth daily.    . Nutritional Supplements (ESTROVEN PO) Take by mouth daily.    Marland Kitchen VIORELE 0.15-0.02/0.01 MG (21/5) tablet Take 1 tablet by mouth daily.  10   No current facility-administered medications on file prior to visit.     Past Surgical History:  Procedure Laterality Date  . BREAST BIOPSY Left 1980  . uterine cyst  1990    Allergies  Allergen Reactions  . Indomethacin     REACTION: itching  . Sulfonamide Derivatives     REACTION: itching  . Penicillins Rash    Social History   Social History  . Marital status: Single    Spouse name: N/A  . Number of children: N/A  . Years of education: N/A   Occupational History  . Not on file.   Social History Main Topics  . Smoking status: Never Smoker  . Smokeless tobacco: Never Used  . Alcohol use 8.4 oz/week    14 Glasses of wine per week  . Drug use: No  . Sexual activity: Not on file   Other Topics Concern  . Not on file   Social History Narrative  . No narrative on file    Family History  Problem Relation Age of Onset  . Colon cancer Mother 7    BP 106/67    Pulse 62   Ht 5\' 4"  (1.626 m)   Wt 128 lb (58.1 kg)   BMI 21.97 kg/m   Review of Systems: See HPI above.     Objective:  Physical Exam:  Gen: NAD, comfortable in exam room  Back: No gross deformity, scoliosis. TTP right lumbar paraspinal region.  No midline or bony TTP. FROM. Strength LEs 5/5 all muscle groups.   2+ MSRs in patellar and achilles tendons, equal bilaterally. Negative SLRs. Sensation intact to light touch bilaterally. Negative logroll bilateral hips Negative fabers and piriformis stretches.   Assessment & Plan:  1. Low back injury - consistent with lumbar strain, less likely a small disc herniation.  Will start with diclofenac with norco and robaxin if needed.  Consider physical therapy, prednisone, MRI if not improving as expected.  F/u in 4 weeks but call us in 1-2 weeks if not improving.

## 2017-01-24 ENCOUNTER — Encounter: Payer: Self-pay | Admitting: Family Medicine

## 2017-01-24 ENCOUNTER — Ambulatory Visit (INDEPENDENT_AMBULATORY_CARE_PROVIDER_SITE_OTHER): Payer: BLUE CROSS/BLUE SHIELD | Admitting: Family Medicine

## 2017-01-24 DIAGNOSIS — S3992XD Unspecified injury of lower back, subsequent encounter: Secondary | ICD-10-CM

## 2017-01-24 NOTE — Assessment & Plan Note (Signed)
consistent with lumbar strain, less likely a small disc herniation.  Clinically improved following diclofenac, robaxin as needed, home exercises.  F/u prn.

## 2017-01-24 NOTE — Progress Notes (Signed)
PCP: No PCP Per Patient  Subjective:   HPI: Patient is a 61 y.o. female here for low back pain.  3/26: Patient reports she's had about 1 week of right sided low back pain. Paint reports she was trying to move the mattress when changing sheets and felt a pull in right side of low back. Didn't start to get radiation into right leg until yesterday. No numbness or tingling. Pain with sitting, standing, and lying down. Taking ibuprofen and using lidocaine patches. Pain level is 5/10, sharp right low back. No bowel/bladder dysfunction.  4/23: Patient reports she's doing well. Pain is 0/10 level. Maybe a little twinge on right side at times. Has a standing desk which helps. Takes robaxin at night, some diclofenac but has weaned off this as well. No numbness/tingling. No bowel/bladder dysfunction.  Past Medical History:  Diagnosis Date  . Osteopenia     Current Outpatient Prescriptions on File Prior to Visit  Medication Sig Dispense Refill  . calcium carbonate (CALCIUM 600) 600 MG TABS tablet Take by mouth.    Marland Kitchen CINNAMON PO Take by mouth daily.    . diclofenac (VOLTAREN) 75 MG EC tablet Take 1 tablet (75 mg total) by mouth 2 (two) times daily. 60 tablet 1  . Estradiol (ESTRACE PO) Take by mouth.    Babette Relic PRIMROSE OIL PO Take by mouth daily.    . Flaxseed, Linseed, (FLAXSEED OIL PO) Take by mouth daily.    Marland Kitchen HYDROcodone-acetaminophen (NORCO) 5-325 MG tablet Take 1 tablet by mouth every 6 (six) hours as needed for moderate pain. 20 tablet 0  . MAGNESIUM PO Take by mouth daily.    . methocarbamol (ROBAXIN) 500 MG tablet Take 1 tablet (500 mg total) by mouth every 8 (eight) hours as needed. 60 tablet 1  . Nutritional Supplements (ESTROVEN PO) Take by mouth daily.    Marland Kitchen VIORELE 0.15-0.02/0.01 MG (21/5) tablet Take 1 tablet by mouth daily.  10   No current facility-administered medications on file prior to visit.     Past Surgical History:  Procedure Laterality Date  . BREAST  BIOPSY Left 1980  . uterine cyst  1990    Allergies  Allergen Reactions  . Indomethacin     REACTION: itching  . Sulfonamide Derivatives     REACTION: itching  . Penicillins Rash    Social History   Social History  . Marital status: Single    Spouse name: N/A  . Number of children: N/A  . Years of education: N/A   Occupational History  . Not on file.   Social History Main Topics  . Smoking status: Never Smoker  . Smokeless tobacco: Never Used  . Alcohol use 8.4 oz/week    14 Glasses of wine per week  . Drug use: No  . Sexual activity: Not on file   Other Topics Concern  . Not on file   Social History Narrative  . No narrative on file    Family History  Problem Relation Age of Onset  . Colon cancer Mother 69    BP 110/81   Pulse 67   Ht 5\' 4"  (1.626 m)   Wt 128 lb (58.1 kg)   BMI 21.97 kg/m   Review of Systems: See HPI above.     Objective:  Physical Exam:  Gen: NAD, comfortable in exam room  Back: No gross deformity, scoliosis. Minimal TTP right lumbar paraspinal region.  No midline or bony TTP. FROM. Strength LEs 5/5 all muscle groups.  2+ MSRs in patellar and achilles tendons, equal bilaterally. Negative SLRs. Sensation intact to light touch bilaterally. Negative logroll bilateral hips   Assessment & Plan:  1. Low back injury - consistent with lumbar strain, less likely a small disc herniation.  Clinically improved following diclofenac, robaxin as needed, home exercises.  F/u prn.

## 2017-06-10 ENCOUNTER — Other Ambulatory Visit: Payer: Self-pay | Admitting: Obstetrics and Gynecology

## 2017-06-10 DIAGNOSIS — Z1231 Encounter for screening mammogram for malignant neoplasm of breast: Secondary | ICD-10-CM

## 2017-07-11 ENCOUNTER — Ambulatory Visit
Admission: RE | Admit: 2017-07-11 | Discharge: 2017-07-11 | Disposition: A | Discharge status: Home / Self Care | Payer: BLUE CROSS/BLUE SHIELD | Source: Ambulatory Visit | Attending: Obstetrics and Gynecology | Admitting: Obstetrics and Gynecology

## 2017-07-11 DIAGNOSIS — Z1231 Encounter for screening mammogram for malignant neoplasm of breast: Secondary | ICD-10-CM

## 2020-12-25 ENCOUNTER — Other Ambulatory Visit: Payer: Self-pay | Admitting: Obstetrics and Gynecology

## 2020-12-25 DIAGNOSIS — R928 Other abnormal and inconclusive findings on diagnostic imaging of breast: Secondary | ICD-10-CM

## 2021-01-14 ENCOUNTER — Other Ambulatory Visit: Payer: Self-pay | Admitting: Obstetrics and Gynecology

## 2021-01-14 ENCOUNTER — Other Ambulatory Visit: Payer: Self-pay

## 2021-01-14 ENCOUNTER — Ambulatory Visit
Admission: RE | Admit: 2021-01-14 | Discharge: 2021-01-14 | Disposition: A | Discharge status: Home / Self Care | Payer: BC Managed Care – PPO | Source: Ambulatory Visit | Attending: Obstetrics and Gynecology | Admitting: Obstetrics and Gynecology

## 2021-01-14 DIAGNOSIS — R928 Other abnormal and inconclusive findings on diagnostic imaging of breast: Secondary | ICD-10-CM

## 2021-01-14 DIAGNOSIS — R921 Mammographic calcification found on diagnostic imaging of breast: Secondary | ICD-10-CM

## 2021-01-16 ENCOUNTER — Other Ambulatory Visit: Payer: Self-pay | Admitting: Obstetrics and Gynecology

## 2021-01-16 DIAGNOSIS — R921 Mammographic calcification found on diagnostic imaging of breast: Secondary | ICD-10-CM

## 2021-02-01 DIAGNOSIS — C801 Malignant (primary) neoplasm, unspecified: Secondary | ICD-10-CM

## 2021-02-01 HISTORY — DX: Malignant (primary) neoplasm, unspecified: C80.1

## 2021-02-04 ENCOUNTER — Ambulatory Visit
Admission: RE | Admit: 2021-02-04 | Discharge: 2021-02-04 | Disposition: A | Discharge status: Home / Self Care | Payer: 59 | Source: Ambulatory Visit | Attending: Obstetrics and Gynecology | Admitting: Obstetrics and Gynecology

## 2021-02-04 ENCOUNTER — Other Ambulatory Visit: Payer: Self-pay

## 2021-02-04 DIAGNOSIS — R921 Mammographic calcification found on diagnostic imaging of breast: Secondary | ICD-10-CM

## 2021-02-06 ENCOUNTER — Telehealth: Payer: Self-pay | Admitting: *Deleted

## 2021-02-06 ENCOUNTER — Encounter: Payer: Self-pay | Admitting: *Deleted

## 2021-02-06 DIAGNOSIS — D0512 Intraductal carcinoma in situ of left breast: Secondary | ICD-10-CM | POA: Insufficient documentation

## 2021-02-06 NOTE — Telephone Encounter (Signed)
Confirmed [facility] for 02/11/21 at 1215 .  Instructions and contact information given.

## 2021-02-11 ENCOUNTER — Ambulatory Visit: Payer: 59 | Admitting: Physical Therapy

## 2021-02-11 ENCOUNTER — Encounter: Payer: Self-pay | Admitting: Hematology

## 2021-02-11 ENCOUNTER — Encounter: Payer: Self-pay | Admitting: *Deleted

## 2021-02-11 ENCOUNTER — Encounter: Payer: Self-pay | Admitting: General Practice

## 2021-02-11 ENCOUNTER — Ambulatory Visit (HOSPITAL_BASED_OUTPATIENT_CLINIC_OR_DEPARTMENT_OTHER): Payer: 59 | Admitting: Genetic Counselor

## 2021-02-11 ENCOUNTER — Ambulatory Visit
Admission: RE | Admit: 2021-02-11 | Discharge: 2021-02-11 | Disposition: A | Discharge status: Home / Self Care | Payer: 59 | Source: Ambulatory Visit | Attending: Radiation Oncology | Admitting: Radiation Oncology

## 2021-02-11 ENCOUNTER — Other Ambulatory Visit: Payer: Self-pay

## 2021-02-11 ENCOUNTER — Inpatient Hospital Stay: Payer: 59

## 2021-02-11 ENCOUNTER — Inpatient Hospital Stay: Payer: 59 | Attending: Hematology | Admitting: Hematology

## 2021-02-11 ENCOUNTER — Other Ambulatory Visit: Payer: Self-pay | Admitting: Surgery

## 2021-02-11 VITALS — BP 144/80 | HR 84 | Temp 97.9°F | Resp 18 | Ht 64.0 in | Wt 132.2 lb

## 2021-02-11 DIAGNOSIS — D0512 Intraductal carcinoma in situ of left breast: Secondary | ICD-10-CM | POA: Insufficient documentation

## 2021-02-11 DIAGNOSIS — Z800 Family history of malignant neoplasm of digestive organs: Secondary | ICD-10-CM | POA: Insufficient documentation

## 2021-02-11 DIAGNOSIS — E7800 Pure hypercholesterolemia, unspecified: Secondary | ICD-10-CM | POA: Insufficient documentation

## 2021-02-11 DIAGNOSIS — M8580 Other specified disorders of bone density and structure, unspecified site: Secondary | ICD-10-CM | POA: Insufficient documentation

## 2021-02-11 DIAGNOSIS — Z803 Family history of malignant neoplasm of breast: Secondary | ICD-10-CM | POA: Insufficient documentation

## 2021-02-11 LAB — CMP (CANCER CENTER ONLY)
ALT: 23 U/L (ref 0–44)
AST: 36 U/L (ref 15–41)
Albumin: 4.5 g/dL (ref 3.5–5.0)
Alkaline Phosphatase: 90 U/L (ref 38–126)
Anion gap: 12 (ref 5–15)
BUN: 21 mg/dL (ref 8–23)
CO2: 28 mmol/L (ref 22–32)
Calcium: 9.9 mg/dL (ref 8.9–10.3)
Chloride: 101 mmol/L (ref 98–111)
Creatinine: 0.95 mg/dL (ref 0.44–1.00)
GFR, Estimated: >60 mL/min (ref >60)
Glucose, Bld: 101 mg/dL — ABNORMAL HIGH (ref 70–99)
Potassium: 4.3 mmol/L (ref 3.5–5.1)
Sodium: 141 mmol/L (ref 135–145)
Total Bilirubin: 0.6 mg/dL (ref 0.3–1.2)
Total Protein: 8 g/dL (ref 6.5–8.1)

## 2021-02-11 LAB — CBC WITH DIFFERENTIAL (CANCER CENTER ONLY)
Abs Immature Granulocytes: 0.01 10*3/uL (ref 0.00–0.07)
Basophils Absolute: 0.1 10*3/uL (ref 0.0–0.1)
Basophils Relative: 1 %
Eosinophils Absolute: 0.1 10*3/uL (ref 0.0–0.5)
Eosinophils Relative: 1 %
HCT: 40 % (ref 36.0–46.0)
Hemoglobin: 13.6 g/dL (ref 12.0–15.0)
Immature Granulocytes: 0 %
Lymphocytes Relative: 18 %
Lymphs Abs: 1.3 10*3/uL (ref 0.7–4.0)
MCH: 33.1 pg (ref 26.0–34.0)
MCHC: 34 g/dL (ref 30.0–36.0)
MCV: 97.3 fL (ref 80.0–100.0)
Monocytes Absolute: 0.7 10*3/uL (ref 0.1–1.0)
Monocytes Relative: 9 %
Neutro Abs: 5.1 10*3/uL (ref 1.7–7.7)
Neutrophils Relative %: 71 %
Platelet Count: 282 10*3/uL (ref 150–400)
RBC: 4.11 MIL/uL (ref 3.87–5.11)
RDW: 12.3 % (ref 11.5–15.5)
WBC Count: 7.2 10*3/uL (ref 4.0–10.5)
nRBC: 0 % (ref 0.0–0.2)

## 2021-02-11 LAB — GENETIC SCREENING ORDER

## 2021-02-11 NOTE — Progress Notes (Addendum)
Radiation Oncology         [phone] ________________________________  Name: Amanda Wolf        MRN: [id number]  Date of Service: 02/11/2021 DOB: Sep 06, 1956  WU:[id number], Mancel Bale, PA  Coralie Keens, MD     REFERRING PHYSICIAN: Coralie Keens, MD   DIAGNOSIS: The encounter diagnosis was Ductal carcinoma in situ (DCIS) of left breast.   HISTORY OF PRESENT ILLNESS: Amanda Wolf is a 65 y.o. female seen in the multidisciplinary breast clinic for a new diagnosis of left breast cancer. The patient was noted to have screening detected calcifications in the left breast in the upper outer quadrant. The calcifications measured 3 mm. No ultrasound was indicated and stereotactic biopsy revealed a high grade DCIS with calcifications and necrosis. Her cancer was ER positive, PR negative and she's seen today to discuss treatment recommendations for her cancer.    PREVIOUS RADIATION THERAPY: No   PAST MEDICAL HISTORY:  Past Medical History:  Diagnosis Date  . Osteopenia        PAST SURGICAL HISTORY: Past Surgical History:  Procedure Laterality Date  . BREAST BIOPSY Left 1980  . uterine cyst  1990     FAMILY HISTORY:  Family History  Problem Relation Age of Onset  . Colon cancer Mother 50  . Breast cancer Maternal Aunt   . Breast cancer Maternal Grandmother      SOCIAL HISTORY:  reports that she has never smoked. She has never used smokeless tobacco. She reports current alcohol use of about 14.0 standard drinks of alcohol per week. She reports that she does not use drugs. The patient is single and lives in [place]. She is retired from working in Insurance underwriter. She is friends and neighbors with another patient of Dr. Ida Rogue. She enjoys dancing, volunteering, and horseback riding.   ALLERGIES: Indomethacin, Sulfonamide derivatives, and Penicillins   MEDICATIONS:  Current Outpatient Medications  Medication Sig Dispense Refill  . calcium carbonate (CALCIUM 600)  600 MG TABS tablet Take by mouth.    Marland Kitchen CINNAMON PO Take by mouth daily.    . diclofenac (VOLTAREN) 75 MG EC tablet Take 1 tablet (75 mg total) by mouth 2 (two) times daily. 60 tablet 1  . Estradiol (ESTRACE PO) Take by mouth.    Babette Relic PRIMROSE OIL PO Take by mouth daily.    . Flaxseed, Linseed, (FLAXSEED OIL PO) Take by mouth daily.    Marland Kitchen HYDROcodone-acetaminophen (NORCO) 5-325 MG tablet Take 1 tablet by mouth every 6 (six) hours as needed for moderate pain. 20 tablet 0  . MAGNESIUM PO Take by mouth daily.    . methocarbamol (ROBAXIN) 500 MG tablet Take 1 tablet (500 mg total) by mouth every 8 (eight) hours as needed. 60 tablet 1  . Nutritional Supplements (ESTROVEN PO) Take by mouth daily.    Marland Kitchen VIORELE 0.15-0.02/0.01 MG (21/5) tablet Take 1 tablet by mouth daily.  10   No current facility-administered medications for this encounter.     REVIEW OF SYSTEMS: On review of systems, the patient reports that she is doing well overall. No specific breast complaints are noted.    PHYSICAL EXAM:  Wt Readings from Last 3 Encounters:  01/24/17 128 lb (58.1 kg)  12/27/16 128 lb (58.1 kg)  03/12/16 129 lb (58.5 kg)   Temp Readings from Last 3 Encounters:  03/12/16 97.7 F (36.5 C)   BP Readings from Last 3 Encounters:  01/24/17 110/81  12/27/16 106/67  03/12/16 123/78   Pulse  Readings from Last 3 Encounters:  01/24/17 67  12/27/16 62  03/12/16 63    In general this is a well appearing caucasian female in no acute distress. She's alert and oriented x4 and appropriate throughout the examination. Cardiopulmonary assessment is negative for acute distress and she exhibits normal effort. Bilateral breast exam is deferred.    ECOG = 0  0 - Asymptomatic (Fully active, able to carry on all predisease activities without restriction)  1 - Symptomatic but completely ambulatory (Restricted in physically strenuous activity but ambulatory and able to carry out work of a light or sedentary  nature. For example, light housework, office work)  2 - Symptomatic, <50% in bed during the day (Ambulatory and capable of all self care but unable to carry out any work activities. Up and about more than 50% of waking hours)  3 - Symptomatic, >50% in bed, but not bedbound (Capable of only limited self-care, confined to bed or chair 50% or more of waking hours)  4 - Bedbound (Completely disabled. Cannot carry on any self-care. Totally confined to bed or chair)  5 - Death   Eustace Pen MM, Creech RH, Tormey DC, et al. [phone]). "Toxicity and response criteria of the [facility]". [place] Oncol. 5 (6): 649-55    LABORATORY DATA:  Lab Results  Component Value Date   WBC 5.1 09/12/2010   HGB 12.3 09/12/2010   HCT 35.9 (L) 09/12/2010   MCV 94.0 09/12/2010   PLT 216 09/12/2010   Lab Results  Component Value Date   NA 141 09/12/2010   K 3.8 09/12/2010   CL 109 09/12/2010   CO2 21 09/12/2010   Lab Results  Component Value Date   ALT 22 09/12/2010   AST 32 09/12/2010   ALKPHOS 50 09/12/2010   BILITOT 0.3 09/12/2010      RADIOGRAPHY: MM Digital Diagnostic Unilat L  Result Date: 01/14/2021 CLINICAL DATA:  65 year old female for further evaluation of LEFT breast calcifications identified on screening exam. EXAM: DIGITAL DIAGNOSTIC UNILATERAL LEFT MAMMOGRAM WITH CAD TECHNIQUE: Left digital diagnostic mammography was performed. Mammographic images were processed with CAD. COMPARISON:  Previous exam(s). ACR Breast Density Category c: The breast tissue is heterogeneously dense, which may obscure small masses. FINDINGS: Full field and magnification views of the LEFT breast demonstrate a 0.3 cm group of round calcifications within the posterior UPPER-OUTER LEFT breast. No suspicious forms are identified. IMPRESSION: 0.3 cm group of UPPER-OUTER LEFT breast calcifications, likely benign. Options of short-term follow-up and tissue sampling were presented. We both agreed to  proceed with short-term follow-up. RECOMMENDATION: LEFT diagnostic mammogram with magnification views in 6 months. I have discussed the findings and recommendations with the patient. If applicable, a reminder letter will be sent to the patient regarding the next appointment. BI-RADS CATEGORY  3: Probably benign. Electronically Signed   By: Margarette Canada M.D.   On: 01/14/2021 14:15   MM CLIP PLACEMENT LEFT  Result Date: 02/04/2021 CLINICAL DATA:  Confirmation of clip placement after stereotactic tomosynthesis core needle biopsy calcifications involving the OUTER LEFT breast at POSTERIOR depth. EXAM: 2D AND 3D DIAGNOSTIC LEFT MAMMOGRAM POST STEREOTACTIC BIOPSY COMPARISON:  Previous exam(s). FINDINGS: Tomosynthesis and synthesized full field CC and mediolateral images were obtained following stereotactic tomosynthesis guided biopsy of calcifications involving the outer LEFT breast at posterior depth, slight UPPER OUTER QUADRANT. The coil shaped biopsy marking clip is appropriately positioned at the site of the biopsied calcifications. There are a few residual calcifications at the  biopsy site. Expected post biopsy changes are present without evidence of hematoma. IMPRESSION: Appropriate positioning of the coil shaped biopsy marking clip at the site of the biopsied calcifications in the outer LEFT breast at posterior depth, slight UPPER OUTER QUADRANT. Final Assessment: Post Procedure Mammograms for Marker Placement Electronically Signed   By: Evangeline Dakin M.D.   On: 02/04/2021 11:34   MM LT BREAST BX W LOC DEV 1ST LESION IMAGE BX SPEC STEREO GUIDE  Addendum Date: 02/09/2021   ADDENDUM REPORT: 02/05/2021 15:01 ADDENDUM: Pathology revealed HIGH GRADE DUCTAL CARCINOMA IN SITU WITH CALCIFICATION AND NECROSIS of the Left breast, outer at posterior depth, (coil clip). This was found to be concordant by Dr. Peggye Fothergill. Pathology results were discussed with the patient by telephone. The patient reported doing well  after the biopsy with tenderness at the site. Post biopsy instructions and care were reviewed and questions were answered. The patient was encouraged to call [facility] for any additional concerns. My direct phone number was provided. The patient was referred to [facility] on Feb 11, 2021. Consideration for a bilateral breast MRI for further evaluation of extent of disease given the high grade histology, breast density, and family history. Pathology results reported by Terie Purser, RN on 02/05/2021. Electronically Signed   By: Evangeline Dakin M.D.   On: 02/05/2021 15:01   Result Date: 02/09/2021 CLINICAL DATA:  65 year old with a screening detected 3 mm group of indeterminate calcifications involving the OUTER LEFT breast at POSTERIOR depth. EXAM: LEFT BREAST STEREOTACTIC CORE NEEDLE BIOPSY COMPARISON:  Previous exams. FINDINGS: The patient and I discussed the procedure of stereotactic-guided biopsy including benefits and alternatives. We discussed the high likelihood of a successful procedure. We discussed the risks of the procedure including infection, bleeding, tissue injury, clip migration, and inadequate sampling. Informed written consent was given. The usual time out protocol was performed immediately prior to the procedure. Lesion quadrant: Outer breast, slight UPPER OUTER QUADRANT. Using sterile technique with chlorhexidine as skin antisepsis, 1% lidocaine and 1% lidocaine with epinephrine as local anesthetic, under stereotactic tomosynthesis guidance, a 9 gauge Brevera vacuum assisted device was used to perform core needle biopsy of calcifications in the outer LEFT breast at posterior depth using a lateral approach. Specimen radiograph was performed showing calcifications in at least 2 of the core samples. Specimens with calcifications are identified for pathology. At the conclusion of the procedure, a  coil shaped tissue marker clip was deployed into the biopsy cavity. Follow-up 2-view mammogram was performed and dictated separately. IMPRESSION: Stereotactic-guided biopsy of calcifications involving the outer LEFT breast at posterior depth. No apparent complications. Electronically Signed: By: Evangeline Dakin M.D. On: 02/04/2021 11:33       IMPRESSION/PLAN: 1. High grade, ER  Positive DCIS of the left breast. Dr. Lisbeth Renshaw discusses the pathology findings and reviews the nature of noninvasive breast disease. The consensus from the breast conference includes breast conservation with lumpectomy. Dr. Lisbeth Renshaw discusses the rationale for  external radiotherapy to the breast  to reduce risks of local recurrence followed by antiestrogen therapy. We discussed the risks, benefits, short, and long term effects of radiotherapy, as well as the curative intent, and the patient is interested in proceeding with treatment but is unsure if she would proceed with all the treatment discussed today. Dr. Lisbeth Renshaw discusses the delivery and logistics of radiotherapy and anticipates a course of 4 weeks of radiotherapy. We will see  her back a few weeks after surgery to discuss the simulation process and anticipate we starting radiotherapy about 4-6 weeks after surgery.  2. Possible genetic predisposition to malignancy. The patient is a candidate for genetic testing given her personal and family history. She was offered referral and will meet with genetic testing today.   In a visit lasting 60 minutes, greater than 50% of the time was spent face to face reviewing her case, as well as in preparation of, discussing, and coordinating the patient's care.  The above documentation reflects my direct findings during this shared patient visit. Please see the separate note by Dr. Lisbeth Renshaw on this date for the remainder of the patient's plan of care.    Carola Rhine, [facility]    **Disclaimer: This note was dictated with voice recognition  software. Similar sounding words can inadvertently be transcribed and this note may contain transcription errors which may not have been corrected upon publication of note.**

## 2021-02-11 NOTE — Addendum Note (Signed)
Encounter addended by: Hayden Pedro, PA-C on: 02/11/2021 1:53 PM  Actions taken: Clinical Note Signed

## 2021-02-11 NOTE — Progress Notes (Signed)
[place]   [phone] Fax:([phone] [facility] Note   Patient Care Team: Lois Huxley, PA as PCP - General (Family Medicine) Mauro Kaufmann, RN as Oncology Nurse Navigator Rockwell Germany, RN as Oncology Nurse Navigator Coralie Keens, MD as Consulting Physician (General Surgery) Truitt Merle, MD as Consulting Physician (Hematology) Kyung Rudd, MD as Consulting Physician (Radiation Oncology) Dian Queen, MD as Consulting Physician (Obstetrics and Gynecology)  Date of Service:  02/11/2021   CHIEF COMPLAINTS/PURPOSE OF CONSULTATION:  Newly Diagnosed Ductal carcinoma in situ (DCIS) of left breast   Oncology History Overview Note  Cancer Staging Ductal carcinoma in situ (DCIS) of left breast Staging form: Breast, AJCC 8th Edition - Clinical stage from 02/04/2021: Stage 0 (cTis (DCIS), cN0, cM0, G3, ER+, PR-, HER2: Not Assessed) - Signed by Truitt Merle, MD on 02/10/2021 Stage prefix: Initial diagnosis Histologic grading system: 3 grade system    Ductal carcinoma in situ (DCIS) of left breast  01/14/2021 Mammogram   IMPRESSION: 0.3 cm group of UPPER-OUTER LEFT breast calcifications, likely benign. Options of short-term follow-up and tissue sampling were presented. We both agreed to proceed with short-term follow-up.   02/04/2021 Cancer Staging   Staging form: Breast, AJCC 8th Edition - Clinical stage from 02/04/2021: Stage 0 (cTis (DCIS), cN0, cM0, G3, ER+, PR-, HER2: Not Assessed) - Signed by Truitt Merle, MD on 02/10/2021 Stage prefix: Initial diagnosis Histologic grading system: 3 grade system   02/04/2021 Initial Biopsy   Diagnosis Breast, left, needle core biopsy, outer at posterior depth, coil clip - DUCTAL CARCINOMA IN SITU WITH CALCIFICATION AND NECROSIS, SEE COMMENT. Microscopic Comment The carcinoma appears high grade. Prognostic makers will be ordered. Dr. Saralyn Pilar has reviewed the case. The case was called to [facility] on 02/05/2021.   PROGNOSTIC INDICATORS Results: IMMUNOHISTOCHEMICAL AND MORPHOMETRIC ANALYSIS PERFORMED MANUALLY Estrogen Receptor: 95%, POSITIVE, STRONG STAINING INTENSITY Progesterone Receptor: 0%, NEGATIVE   02/06/2021 Initial Diagnosis   Ductal carcinoma in situ (DCIS) of left breast      HISTORY OF PRESENTING ILLNESS:  Amanda Wolf 65 y.o. female is a here because of newly diagnosed left breast DCIS. The patient presents to the clinic today accompanied by her friend.   She notes her mass was found by screening mammogram. She did not feel the mass herself. She notes history of fibrous breast tissue which was benign. She denies any current issues. She is overall at baseline.   She has a PMHx of high cholesterol, controlled on medication. She denies h/o of surgery. I reviewed her medication list with her. She is mostly on supplements. Her mother pass from colon cancer when she was 22 and maternal aunt with breast cancer in her 85. She notes her sister had DCIS in her 64s. Her Grandmother had breast cancer when she was 40. She is agreeable with genetic testing.   Socially she is single and has not been pregnant. She is retired after working from an [employer].  She drinks wine 2-3 glasses a day. She does not smoke. She is overall active with walking, rowing, dance.   GYN HISTORY  Menarchal: 18 LMP: 2020 Contraceptive:35+ years  HRT: HR for 1 year, Renaee Munda for 2 years, She stopped recently.  GP0    REVIEW OF SYSTEMS:    Constitutional: Denies fevers, chills or abnormal night sweats Eyes: Denies blurriness of vision, double vision or watery eyes Ears, nose, mouth, throat, and face: Denies mucositis or sore  throat Respiratory: Denies cough, dyspnea or wheezes Cardiovascular: Denies palpitation, chest discomfort or lower extremity swelling Gastrointestinal:  Denies nausea, heartburn or change in bowel habits Skin: Denies abnormal skin  rashes Lymphatics: Denies new lymphadenopathy or easy bruising Neurological:Denies numbness, tingling or new weaknesses Behavioral/Psych: Mood is stable, no new changes  All other systems were reviewed with the patient and are negative.   MEDICAL HISTORY:  Past Medical History:  Diagnosis Date  . Osteopenia     SURGICAL HISTORY: Past Surgical History:  Procedure Laterality Date  . BREAST BIOPSY Left 1980  . uterine cyst  1990    SOCIAL HISTORY: Social History   Socioeconomic History  . Marital status: Single    Spouse name: Not on file  . Number of children: 0  . Years of education: Not on file  . Highest education level: Not on file  Occupational History  . Occupation: retired   Tobacco Use  . Smoking status: Never Smoker  . Smokeless tobacco: Never Used  Substance and Sexual Activity  . Alcohol use: Yes    Alcohol/week: 14.0 standard drinks    Types: 14 Glasses of wine per week  . Drug use: No  . Sexual activity: Not on file  Other Topics Concern  . Not on file  Social History Narrative  . Not on file   Social Determinants of Health   Financial Resource Strain: Not on file  Food Insecurity: Not on file  Transportation Needs: Not on file  Physical Activity: Not on file  Stress: Not on file  Social Connections: Not on file  Intimate Partner Violence: Not on file    FAMILY HISTORY: Family History  Problem Relation Age of Onset  . Colon cancer Mother 62  . Breast cancer Maternal Aunt 80  . Breast cancer Maternal Grandmother   . Breast cancer Sister 17       breast cancer     ALLERGIES:  is allergic to indomethacin, sulfonamide derivatives, and penicillins.  MEDICATIONS:  Current Outpatient Medications  Medication Sig Dispense Refill  . calcium carbonate (CALCIUM 600) 600 MG TABS tablet Take by mouth.    Marland Kitchen CINNAMON PO Take by mouth daily.    . diclofenac (VOLTAREN) 75 MG EC tablet Take 1 tablet (75 mg total) by mouth 2 (two) times daily. 60 tablet  1  . Estradiol (ESTRACE PO) Take by mouth.    Babette Relic PRIMROSE OIL PO Take by mouth daily.    . Flaxseed, Linseed, (FLAXSEED OIL PO) Take by mouth daily.    Marland Kitchen HYDROcodone-acetaminophen (NORCO) 5-325 MG tablet Take 1 tablet by mouth every 6 (six) hours as needed for moderate pain. 20 tablet 0  . MAGNESIUM PO Take by mouth daily.    . methocarbamol (ROBAXIN) 500 MG tablet Take 1 tablet (500 mg total) by mouth every 8 (eight) hours as needed. 60 tablet 1  . Nutritional Supplements (ESTROVEN PO) Take by mouth daily.    Marland Kitchen VIORELE 0.15-0.02/0.01 MG (21/5) tablet Take 1 tablet by mouth daily.  10   No current facility-administered medications for this visit.    PHYSICAL EXAMINATION: ECOG PERFORMANCE STATUS: 0 - Asymptomatic  Vitals:   02/11/21 1253  BP: (!) 144/80  Pulse: 84  Resp: 18  Temp: 97.9 F (36.6 C)  SpO2: 100%   Filed Weights   02/11/21 1253  Weight: 132 lb 3.2 oz (60 kg)    Due to COVID19 we will limit examination to appearance. Patient had no complaints.  GENERAL:alert, no  distress and comfortable SKIN: skin color normal, no rashes or significant lesions EYES: normal, Conjunctiva are pink and non-injected, sclera clear  NEURO: alert & oriented x 3 with fluent speech   LABORATORY DATA:  I have reviewed the data as listed CBC Latest Ref Rng & Units 02/11/2021 09/12/2010  WBC 4.0 - 10.5 K/uL 7.2 5.1  Hemoglobin 12.0 - 15.0 g/dL 13.6 12.3  Hematocrit 36.0 - 46.0 % 40.0 35.9(L)  Platelets 150 - 400 K/uL 282 216    CMP Latest Ref Rng & Units 02/11/2021 09/12/2010  Glucose 70 - 99 mg/dL 101(H) 94  BUN 8 - 23 mg/dL 21 14  Creatinine 0.44 - 1.00 mg/dL 0.95 0.88  Sodium 135 - 145 mmol/L 141 141  Potassium 3.5 - 5.1 mmol/L 4.3 3.8  Chloride 98 - 111 mmol/L 101 109  CO2 22 - 32 mmol/L 28 21  Calcium 8.9 - 10.3 mg/dL 9.9 9.1  Total Protein 6.5 - 8.1 g/dL 8.0 7.1  Total Bilirubin 0.3 - 1.2 mg/dL 0.6 0.3  Alkaline Phos 38 - 126 U/L 90 50  AST 15 - 41 U/L 36 32  ALT 0  - 44 U/L 23 22     RADIOGRAPHIC STUDIES: I have personally reviewed the radiological images as listed and agreed with the findings in the report. MM Digital Diagnostic Unilat L  Result Date: 01/14/2021 CLINICAL DATA:  65 year old female for further evaluation of LEFT breast calcifications identified on screening exam. EXAM: DIGITAL DIAGNOSTIC UNILATERAL LEFT MAMMOGRAM WITH CAD TECHNIQUE: Left digital diagnostic mammography was performed. Mammographic images were processed with CAD. COMPARISON:  Previous exam(s). ACR Breast Density Category c: The breast tissue is heterogeneously dense, which may obscure small masses. FINDINGS: Full field and magnification views of the LEFT breast demonstrate a 0.3 cm group of round calcifications within the posterior UPPER-OUTER LEFT breast. No suspicious forms are identified. IMPRESSION: 0.3 cm group of UPPER-OUTER LEFT breast calcifications, likely benign. Options of short-term follow-up and tissue sampling were presented. We both agreed to proceed with short-term follow-up. RECOMMENDATION: LEFT diagnostic mammogram with magnification views in 6 months. I have discussed the findings and recommendations with the patient. If applicable, a reminder letter will be sent to the patient regarding the next appointment. BI-RADS CATEGORY  3: Probably benign. Electronically Signed   By: Margarette Canada M.D.   On: 01/14/2021 14:15   MM CLIP PLACEMENT LEFT  Result Date: 02/04/2021 CLINICAL DATA:  Confirmation of clip placement after stereotactic tomosynthesis core needle biopsy calcifications involving the OUTER LEFT breast at POSTERIOR depth. EXAM: 2D AND 3D DIAGNOSTIC LEFT MAMMOGRAM POST STEREOTACTIC BIOPSY COMPARISON:  Previous exam(s). FINDINGS: Tomosynthesis and synthesized full field CC and mediolateral images were obtained following stereotactic tomosynthesis guided biopsy of calcifications involving the outer LEFT breast at posterior depth, slight UPPER OUTER QUADRANT. The coil  shaped biopsy marking clip is appropriately positioned at the site of the biopsied calcifications. There are a few residual calcifications at the biopsy site. Expected post biopsy changes are present without evidence of hematoma. IMPRESSION: Appropriate positioning of the coil shaped biopsy marking clip at the site of the biopsied calcifications in the outer LEFT breast at posterior depth, slight UPPER OUTER QUADRANT. Final Assessment: Post Procedure Mammograms for Marker Placement Electronically Signed   By: Evangeline Dakin M.D.   On: 02/04/2021 11:34   MM LT BREAST BX W LOC DEV 1ST LESION IMAGE BX SPEC STEREO GUIDE  Addendum Date: 02/09/2021   ADDENDUM REPORT: 02/05/2021 15:01 ADDENDUM: Pathology revealed HIGH GRADE DUCTAL  CARCINOMA IN SITU WITH CALCIFICATION AND NECROSIS of the Left breast, outer at posterior depth, (coil clip). This was found to be concordant by Dr. Peggye Fothergill. Pathology results were discussed with the patient by telephone. The patient reported doing well after the biopsy with tenderness at the site. Post biopsy instructions and care were reviewed and questions were answered. The patient was encouraged to call [facility] for any additional concerns. My direct phone number was provided. The patient was referred to [facility] on Feb 11, 2021. Consideration for a bilateral breast MRI for further evaluation of extent of disease given the high grade histology, breast density, and family history. Pathology results reported by Terie Purser, RN on 02/05/2021. Electronically Signed   By: Evangeline Dakin M.D.   On: 02/05/2021 15:01   Result Date: 02/09/2021 CLINICAL DATA:  65 year old with a screening detected 3 mm group of indeterminate calcifications involving the OUTER LEFT breast at POSTERIOR depth. EXAM: LEFT BREAST STEREOTACTIC CORE NEEDLE BIOPSY COMPARISON:  Previous exams. FINDINGS:  The patient and I discussed the procedure of stereotactic-guided biopsy including benefits and alternatives. We discussed the high likelihood of a successful procedure. We discussed the risks of the procedure including infection, bleeding, tissue injury, clip migration, and inadequate sampling. Informed written consent was given. The usual time out protocol was performed immediately prior to the procedure. Lesion quadrant: Outer breast, slight UPPER OUTER QUADRANT. Using sterile technique with chlorhexidine as skin antisepsis, 1% lidocaine and 1% lidocaine with epinephrine as local anesthetic, under stereotactic tomosynthesis guidance, a 9 gauge Brevera vacuum assisted device was used to perform core needle biopsy of calcifications in the outer LEFT breast at posterior depth using a lateral approach. Specimen radiograph was performed showing calcifications in at least 2 of the core samples. Specimens with calcifications are identified for pathology. At the conclusion of the procedure, a coil shaped tissue marker clip was deployed into the biopsy cavity. Follow-up 2-view mammogram was performed and dictated separately. IMPRESSION: Stereotactic-guided biopsy of calcifications involving the outer LEFT breast at posterior depth. No apparent complications. Electronically Signed: By: Evangeline Dakin M.D. On: 02/04/2021 11:33    ASSESSMENT & PLAN:  Amanda Wolf is a 65 y.o. Caucasian female with a history of Osteopenia, High cholesterol.    1. Left breast DCIS, grade III, ER+/PR- -I discussed her breast imaging and needle biopsy results with patient and her family members in great detail. She was found to have 0.3cm mass in her left breast on mammogram. 02/04/21 biopsy showed this to be high grade DCIS with calcifications and necrosis -She is a candidate for breast conservation surgery. She has been seen by breast surgeon Dr. Ninfa Linden, who recommends lumpectomy. -Her DCIS will be cured by complete surgical  resection. Any form of adjuvant therapy is preventive. -I reviewed her risk and treatment benefits using the Breast Cancer Nomogram from [facility]). Based on family, PMx and lifestyle she has a 19% risk of developing future breast cancer in the next 10 years. Her risk would drop to 7-8% with RT or Antiestrogen therapy alone. With both adjuvant treatments her risk would decrease to 4%.  -Given her strongly positive ER, I do recommend antiestrogen therapy with Tamoxifen or Anastrozole, which decrease her risk of future breast cancer by ~50%.   -The potential side effects of Tamoxifen, which includes but not limited to, hot flash, skin and vaginal dryness, slightly increased risk  of cardiovascular disease and cataract, small risk of thrombosis and endometrial cancer, were discussed with her in great details.   -The potential benefit and side effects of AI, which includes but not limited to, hot flash, skin and vaginal dryness, metabolic changes ( increased blood glucose, cholesterol, weight, etc.), slightly in increased risk of cardiovascular disease, cataracts, muscular and joint discomfort, osteopenia and osteoporosis, etc, were discussed with her in great details.  -She will likely benefit from breast radiation if she undergo lumpectomy to decrease the risk of breast cancer. She will discuss this further with Dr Lisbeth Renshaw today.  -We also discussed that biopsy may have sampling limitation, we will review her surgical path, to see if she has any invasive carcinoma components. -We discussed breast cancer surveillance after she completes treatment, Including annual mammogram, breast exam every 6-12 months. -Labs today show, CBC and CMP WNL. She declined breast exam today.  -She will proceed with surgery soon. I will f/u with her after surgery or radiation   2. Osteopenia  -Per patient she has osteopenia. Last DEXA in 2021 with her Gyn Dr Helane Rima -I discussed Tamoxifen can  strengthen her bones compares to AI which can weaken her bone.    3. Genetics -She has strong family history of breast cancer and her mother has colon cancer. She is eligible for genetic testing. She is interested in testing, I will send referral.    4. Hormonal replacement  -She has been on hormonal replacement for the past 2 years, latest is OTC use which likely has low dose estrogen. I recommend she stop given her ER positive breast cancer. She is willing to stop.  -Will monitor her hot flashes on Antiestrogen therapy.    PLAN:  -Send genetic testing referral  -She will proceed with surgery soon -F/u after radiation to finalize adjuvant antiestrogen therapy    No orders of the defined types were placed in this encounter.   All questions were answered. The patient knows to call the clinic with any problems, questions or concerns. The total time spent in the appointment was 45 minutes.     Truitt Merle, MD 02/11/2021   I, Joslyn Devon, am acting as scribe for Truitt Merle, MD.   I have reviewed the above documentation for accuracy and completeness, and I agree with the above.

## 2021-02-12 ENCOUNTER — Encounter: Payer: Self-pay | Admitting: Genetic Counselor

## 2021-02-12 DIAGNOSIS — Z803 Family history of malignant neoplasm of breast: Secondary | ICD-10-CM | POA: Insufficient documentation

## 2021-02-12 DIAGNOSIS — Z800 Family history of malignant neoplasm of digestive organs: Secondary | ICD-10-CM | POA: Insufficient documentation

## 2021-02-12 NOTE — Progress Notes (Signed)
[facility] Psychosocial Distress Screening Spiritual Care  Met with Amanda Wolf and her friend Amanda Wolf in [facility] to introduce [facility] team/resources, reviewing distress screen per protocol.  The patient scored a 3 on the Psychosocial Distress Thermometer which indicates mild distress. Also assessed for distress and other psychosocial needs.   ONCBCN DISTRESS SCREENING 02/12/2021  Screening Type Initial Screening  Distress experienced in past week (1-10) 3  Referral to support programs Yes   [name] reports good support from family, friends, and faith community.    Follow up needed: No. Per [name], no other needs or concerns at this time, but she is aware of ongoing [place] and [place] (Patient and [facility]) team and programming availability, should needs arise or circumstances change.   [place], [place], [facility] Pager [phone] Voicemail [phone]

## 2021-02-12 NOTE — Progress Notes (Signed)
REFERRING PROVIDER: Truitt Merle, MD [place],  [place] [address]  PRIMARY PROVIDER:  Lois Huxley, PA  PRIMARY REASON FOR VISIT:  1. Ductal carcinoma in situ (DCIS) of left breast   2. Family history of breast cancer   3. Family history of colon cancer       HISTORY OF PRESENT ILLNESS:   Amanda Wolf, a 65 y.o. female, was seen for a [place] cancer genetics consultation at the request of Dr. Burr Medico due to a personal and family history of cancer.  Amanda Wolf presents to clinic today to discuss the possibility of a hereditary predisposition to cancer, genetic testing, and to further clarify her future cancer risks, as well as potential cancer risks for family members.   In May of 2022, at the age of 60, Amanda Wolf was diagnosed with ductal carcinoma in situ of the left breast. The tumor is ER+/PR-. The treatment plan includes surgery, radiation therapy, and antiestrogen therapy.    CANCER HISTORY:  Oncology History Overview Note  Cancer Staging Ductal carcinoma in situ (DCIS) of left breast Staging form: Breast, AJCC 8th Edition - Clinical stage from 02/04/2021: Stage 0 (cTis (DCIS), cN0, cM0, G3, ER+, PR-, HER2: Not Assessed) - Signed by Truitt Merle, MD on 02/10/2021 Stage prefix: Initial diagnosis Histologic grading system: 3 grade system    Ductal carcinoma in situ (DCIS) of left breast  01/14/2021 Mammogram   IMPRESSION: 0.3 cm group of UPPER-OUTER LEFT breast calcifications, likely benign. Options of short-term follow-up and tissue sampling were presented. We both agreed to proceed with short-term follow-up.   02/04/2021 Cancer Staging   Staging form: Breast, AJCC 8th Edition - Clinical stage from 02/04/2021: Stage 0 (cTis (DCIS), cN0, cM0, G3, ER+, PR-, HER2: Not Assessed) - Signed by Truitt Merle, MD on 02/10/2021 Stage prefix: Initial diagnosis Histologic grading system: 3 grade system   02/04/2021 Initial Biopsy   Diagnosis Breast, left, needle core biopsy,  outer at posterior depth, coil clip - DUCTAL CARCINOMA IN SITU WITH CALCIFICATION AND NECROSIS, SEE COMMENT. Microscopic Comment The carcinoma appears high grade. Prognostic makers will be ordered. Dr. Saralyn Pilar has reviewed the case. The case was called to [facility] on 02/05/2021.   PROGNOSTIC INDICATORS Results: IMMUNOHISTOCHEMICAL AND MORPHOMETRIC ANALYSIS PERFORMED MANUALLY Estrogen Receptor: 95%, POSITIVE, STRONG STAINING INTENSITY Progesterone Receptor: 0%, NEGATIVE   02/06/2021 Initial Diagnosis   Ductal carcinoma in situ (DCIS) of left breast      RISK FACTORS:  Menarche was at age 57.  No live births.  OCP use for approximately 35+ years.  HRT use: HR for 1 year, Renaee Munda for 2 years, She stopped recently.  Colonoscopy: yes; 03/12/2016. Mammogram within the last year: yes.   Past Medical History:  Diagnosis Date  . Family history of breast cancer   . Family history of colon cancer   . Osteopenia     Past Surgical History:  Procedure Laterality Date  . BREAST BIOPSY Left 1980  . uterine cyst  1990    Social History   Socioeconomic History  . Marital status: Single    Spouse name: Not on file  . Number of children: 0  . Years of education: Not on file  . Highest education level: Not on file  Occupational History  . Occupation: retired   Tobacco Use  . Smoking status: Never Smoker  . Smokeless tobacco: Never Used  Substance and Sexual Activity  . Alcohol use: Yes    Alcohol/week: 14.0  standard drinks    Types: 14 Glasses of wine per week  . Drug use: No  . Sexual activity: Not on file  Other Topics Concern  . Not on file  Social History Narrative  . Not on file   Social Determinants of Health   Financial Resource Strain: Not on file  Food Insecurity: Not on file  Transportation Needs: Not on file  Physical Activity: Not on file  Stress: Not on file  Social Connections: Not on file     FAMILY HISTORY:  We obtained a  detailed, 4-generation family history.  Significant diagnoses are listed below: Family History  Problem Relation Age of Onset  . Colon cancer Mother 49  . Breast cancer Maternal Aunt 70  . Breast cancer Maternal Grandmother 90  . Breast cancer Sister 67       breast cancer   . Stroke Maternal Grandfather   . Skin cancer Cousin        maternal first cousin   Amanda Wolf does not have children. She has one brother (age 55) and and two sisters (ages 31 and 79). One sister was diagnosed with DCIS at age 12.  Amanda Wolf's mother died at age 110 with colon cancer (diagnosed age 20). There are two maternal aunts and one maternal uncle. One aunt had breast cancer (diagnosed age 52). One maternal first cousin had skin cancer. Amanda Wolf maternal grandmother died at age 64 and had breast cancer (diagnosed age 16). Her maternal grandfather died in his early 57s without cancer.  Amanda Wolf father died at age 51 without cancer. There are two paternal aunts and one paternal uncle. There is no known cancer among paternal aunts/uncles or paternal cousins. Amanda Wolf paternal grandmother died at age 14 without cancer. Her paternal grandfather died at age 4 without cancer.   Amanda Wolf is unaware of previous family history of genetic testing for hereditary cancer risks. Patient's maternal ancestors are of unknown descent, and paternal ancestors are of [place] descent. There is no reported Ashkenazi Jewish ancestry. There is no known consanguinity.  GENETIC COUNSELING ASSESSMENT: Amanda Wolf is a 65 y.o. female with a personal history of breast cancer and a family history of breast cancer and colon cancer, which is somewhat suggestive of a hereditary cancer and predisposition to cancer. We, therefore, discussed and recommended the following at today's visit.   DISCUSSION: We discussed that approximately 5-10% of breast cancer is hereditary, with most cases associated with the BRCA1 and BRCA2 genes.  There are other genes that can be associated with hereditary breast cancer syndromes. These include ATM, CHEK2, PALB2, etc. We discussed that testing is beneficial for several reasons, including knowing about other cancer risks, identifying potential screening and risk-reduction options that may be appropriate, and to understand if other family members could be at risk for cancer and allow them to undergo genetic testing.   We reviewed the characteristics, features and inheritance patterns of hereditary cancer syndromes. We also discussed genetic testing, including the appropriate family members to test, the process of testing, insurance coverage and turn-around-time for results. We discussed the implications of a negative, positive and/or variant of uncertain significant result. In order to get genetic test results in a timely manner so that Amanda Wolf can use these genetic test results for surgical decisions, we recommended Amanda Wolf pursue genetic testing for the [employer]. Once complete, we recommend Amanda Wolf pursue reflex genetic testing to the CancerNext-Expanded + RNAinsight panel.   The BRCAplus panel  offered by [facility] [facility] and includes sequencing and deletion/duplication analysis for the following 8 genes: ATM, BRCA1, BRCA2, CDH1, CHEK2, PALB2, PTEN, and TP53. The CancerNext-Expanded + RNAinsight gene panel offered by [employer] and includes sequencing and rearrangement analysis for the following 77 genes: AIP, ALK, APC, ATM, AXIN2, BAP1, BARD1, BLM, BMPR1A, BRCA1, BRCA2, BRIP1, CDC73, CDH1, CDK4, CDKN1B, CDKN2A, CHEK2, CTNNA1, DICER1, FANCC, FH, FLCN, GALNT12, KIF1B, LZTR1, MAX, MEN1, MET, MLH1, MSH2, MSH3, MSH6, MUTYH, NBN, NF1, NF2, NTHL1, PALB2, PHOX2B, PMS2, POT1, PRKAR1A, PTCH1, PTEN, RAD51C, RAD51D, RB1, RECQL, RET, SDHA, SDHAF2, SDHB, SDHC, SDHD, SMAD4, SMARCA4, SMARCB1, SMARCE1, STK11, SUFU, TMEM127, TP53, TSC1, TSC2, VHL and XRCC2 (sequencing and deletion/duplication); EGFR,  EGLN1, HOXB13, KIT, MITF, PDGFRA, POLD1 and POLE (sequencing only); EPCAM and GREM1 (deletion/duplication only). RNA data is routinely analyzed for use in variant interpretation for all genes.  Based on Amanda Wolf's personal and family history of cancer, she meets medical criteria for genetic testing. Despite that she meets criteria, she may still have an out of pocket cost.   PLAN: After considering the risks, benefits, and limitations, Amanda Wolf provided informed consent to pursue genetic testing and the blood sample was sent to [facility] for analysis of the BRCAplus and CancerNext-Expanded + RNAinsight panels. Results should be available within approximately one-two weeks' time, at which point they will be disclosed by telephone to Amanda Wolf, as will any additional recommendations warranted by these results. Amanda Wolf will receive a summary of her genetic counseling visit and a copy of her results once available. This information will also be available in Epic.   Ms. Ciaramitaro's questions were answered to her satisfaction today. Our contact information was provided should additional questions or concerns arise. Thank you for the referral and allowing [place] to share in the care of your patient.   Clint Guy, [place], [facility] Licensed, Certified Dispensing [email or web address] Phone: [phone]  The patient was seen for a total of 25 minutes in face-to-face genetic counseling.  This patient was discussed with Drs. Magrinat, Lindi Adie and/or Burr Medico who agrees with the above.    _______________________________________________________________________ For Office Staff:  Number of people involved in session: 1 Was an Intern/ student involved with case: no

## 2021-02-13 ENCOUNTER — Encounter: Payer: Self-pay | Admitting: *Deleted

## 2021-02-13 ENCOUNTER — Other Ambulatory Visit: Payer: Self-pay | Admitting: Surgery

## 2021-02-13 DIAGNOSIS — D0512 Intraductal carcinoma in situ of left breast: Secondary | ICD-10-CM

## 2021-02-16 ENCOUNTER — Encounter: Payer: Self-pay | Admitting: Hematology

## 2021-02-16 ENCOUNTER — Encounter: Payer: Self-pay | Admitting: Radiation Oncology

## 2021-02-17 ENCOUNTER — Telehealth: Payer: Self-pay | Admitting: *Deleted

## 2021-02-17 ENCOUNTER — Encounter: Payer: Self-pay | Admitting: *Deleted

## 2021-02-17 NOTE — Telephone Encounter (Signed)
Spoke with patient to follow up from [facility] 5/11 and assess navigation needs.  Patient denies any questions or concerns.  Encouraged her to call should anything arise.

## 2021-02-23 DIAGNOSIS — Z1379 Encounter for other screening for genetic and chromosomal anomalies: Secondary | ICD-10-CM | POA: Insufficient documentation

## 2021-02-24 ENCOUNTER — Telehealth: Payer: Self-pay | Admitting: Genetic Counselor

## 2021-02-24 ENCOUNTER — Encounter: Payer: Self-pay | Admitting: Genetic Counselor

## 2021-02-24 NOTE — Telephone Encounter (Signed)
Revealed negative genetic testing through the [facility] panel. Discussed that we do not know why she has breast cancer or why there is cancer in the family. There could be a genetic mutation in the family that [name] did not inherit. There could also be a mutation in a different gene that we are not testing, or our current technology may not be able to detect certain mutations. It will therefore be important for her to stay in contact with genetics to keep up with whether additional testing may be appropriate in the future.   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 Her result is still considered normal at this time and should not impact her medical management.  Additional genetic testing through the [facility] CancerNext-Expanded + RNAinsight panel is currently pending. We will call Amanda Wolf once these results are available.

## 2021-02-24 NOTE — Telephone Encounter (Signed)
LVM that her genetic test results are available and requested that she call back to discuss them.  

## 2021-02-26 ENCOUNTER — Ambulatory Visit: Payer: Self-pay | Admitting: Genetic Counselor

## 2021-02-26 ENCOUNTER — Encounter: Payer: Self-pay | Admitting: Genetic Counselor

## 2021-02-26 DIAGNOSIS — Z1379 Encounter for other screening for genetic and chromosomal anomalies: Secondary | ICD-10-CM

## 2021-02-26 NOTE — Progress Notes (Signed)
HPI:  Amanda Wolf was previously seen in the [place] clinic due to a personal and family history of cancer and concerns regarding a hereditary predisposition to cancer. Please refer to our prior cancer genetics clinic note for more information regarding our discussion, assessment and recommendations, at the time. [name] recent genetic test results were disclosed to her, as were recommendations warranted by these results. These results and recommendations are discussed in more detail below.  CANCER HISTORY:  Oncology History Overview Note  Cancer Staging Ductal carcinoma in situ (DCIS) of left breast Staging form: Breast, AJCC 8th Edition - Clinical stage from 02/04/2021: Stage 0 (cTis (DCIS), cN0, cM0, G3, ER+, PR-, HER2: Not Assessed) - Signed by Truitt Merle, MD on 02/10/2021 Stage prefix: Initial diagnosis Histologic grading system: 3 grade system    Ductal carcinoma in situ (DCIS) of left breast  01/14/2021 Mammogram   IMPRESSION: 0.3 cm group of UPPER-OUTER LEFT breast calcifications, likely benign. Options of short-term follow-up and tissue sampling were presented. We both agreed to proceed with short-term follow-up.   02/04/2021 Cancer Staging   Staging form: Breast, AJCC 8th Edition - Clinical stage from 02/04/2021: Stage 0 (cTis (DCIS), cN0, cM0, G3, ER+, PR-, HER2: Not Assessed) - Signed by Truitt Merle, MD on 02/10/2021 Stage prefix: Initial diagnosis Histologic grading system: 3 grade system   02/04/2021 Initial Biopsy   Diagnosis Breast, left, needle core biopsy, outer at posterior depth, coil clip - DUCTAL CARCINOMA IN SITU WITH CALCIFICATION AND NECROSIS, SEE COMMENT. Microscopic Comment The carcinoma appears high grade. Prognostic makers will be ordered. Dr. Saralyn Pilar has reviewed the case. The case was called to [facility] on 02/05/2021.   PROGNOSTIC INDICATORS Results: IMMUNOHISTOCHEMICAL AND MORPHOMETRIC ANALYSIS PERFORMED  MANUALLY Estrogen Receptor: 95%, POSITIVE, STRONG STAINING INTENSITY Progesterone Receptor: 0%, NEGATIVE   02/06/2021 Initial Diagnosis   Ductal carcinoma in situ (DCIS) of left breast   02/23/2021 Genetic Testing   Negative genetic testing:  No pathogenic variants detected on the [facility] BRCAplus panel or CancerNext-Expanded + RNAinsight panels. A variant of uncertain significance (VUS) was detected in the ATM gene called c.2942G>A (p.R981H). The report date is 02/23/2021.  The BRCAplus panel offered by [facility] [facility] and includes sequencing and deletion/duplication analysis for the following 8 genes: ATM, BRCA1, BRCA2, CDH1, CHEK2, PALB2, PTEN, and TP53. The CancerNext-Expanded + RNAinsight gene panel offered by [employer] [facility] and includes sequencing and rearrangement analysis for the following 77 genes: AIP, ALK, APC, ATM, AXIN2, BAP1, BARD1, BLM, BMPR1A, BRCA1, BRCA2, BRIP1, CDC73, CDH1, CDK4, CDKN1B, CDKN2A, CHEK2, CTNNA1, DICER1, FANCC, FH, FLCN, GALNT12, KIF1B, LZTR1, MAX, MEN1, MET, MLH1, MSH2, MSH3, MSH6, MUTYH, NBN, NF1, NF2, NTHL1, PALB2, PHOX2B, PMS2, POT1, PRKAR1A, PTCH1, PTEN, RAD51C, RAD51D, RB1, RECQL, RET, SDHA, SDHAF2, SDHB, SDHC, SDHD, SMAD4, SMARCA4, SMARCB1, SMARCE1, STK11, SUFU, TMEM127, TP53, TSC1, TSC2, VHL and XRCC2 (sequencing and deletion/duplication); EGFR, EGLN1, HOXB13, KIT, MITF, PDGFRA, POLD1 and POLE (sequencing only); EPCAM and GREM1 (deletion/duplication only). RNA data is routinely analyzed for use in variant interpretation for all genes.     FAMILY HISTORY:  We obtained a detailed, 4-generation family history.  Significant diagnoses are listed below: Family History  Problem Relation Age of Onset  . Colon cancer Mother 54  . Breast cancer Maternal Aunt 70  . Breast cancer Maternal Grandmother 90  . Breast cancer Sister 33       breast cancer   . Stroke Maternal Grandfather   . Skin cancer Cousin  maternal first cousin   Ms. Swim does not have  children. She has one brother (age 28) and and two sisters (ages 75 and 8). One sister was diagnosed with DCIS at age 53.  [name]'s mother died at age 37 with colon cancer (diagnosed age 72). There are two maternal aunts and one maternal uncle. One aunt had breast cancer (diagnosed age 54). One maternal first cousin had skin cancer. [name] maternal grandmother died at age 13 and had breast cancer (diagnosed age 60). Her maternal grandfather died in his early 2s without cancer.  [name] father died at age 13 without cancer. There are two paternal aunts and one paternal uncle. There is no known cancer among paternal aunts/uncles or paternal cousins. [name] paternal grandmother died at age 7 without cancer. Her paternal grandfather died at age 30 without cancer.   [name] is unaware of previous family history of genetic testing for hereditary cancer risks. Patient's maternal ancestors are of unknown descent, and paternal ancestors are of [place] descent. There is no reported Ashkenazi Jewish ancestry. There is no known consanguinity.  GENETIC TEST RESULTS: Genetic testing reported out on 02/23/2021 through the [facility] panel, and 02/25/2021 through the CancerNext-Expanded + RNAinsight panel. No pathogenic variants were detected.   The BRCAplus panel offered by [employer] and includes sequencing and deletion/duplication analysis for the following 8 genes: ATM, BRCA1, BRCA2, CDH1, CHEK2, PALB2, PTEN, and TP53. The CancerNext-Expanded + RNAinsight gene panel offered by [employer] and includes sequencing and rearrangement analysis for the following 77 genes: AIP, ALK, APC, ATM, AXIN2, BAP1, BARD1, BLM, BMPR1A, BRCA1, BRCA2, BRIP1, CDC73, CDH1, CDK4, CDKN1B, CDKN2A, CHEK2, CTNNA1, DICER1, FANCC, FH, FLCN, GALNT12, KIF1B, LZTR1, MAX, MEN1, MET, MLH1, MSH2, MSH3, MSH6, MUTYH, NBN, NF1, NF2, NTHL1, PALB2, PHOX2B, PMS2, POT1, PRKAR1A, PTCH1, PTEN, RAD51C, RAD51D, RB1,  RECQL, RET, SDHA, SDHAF2, SDHB, SDHC, SDHD, SMAD4, SMARCA4, SMARCB1, SMARCE1, STK11, SUFU, TMEM127, TP53, TSC1, TSC2, VHL and XRCC2 (sequencing and deletion/duplication); EGFR, EGLN1, HOXB13, KIT, MITF, PDGFRA, POLD1 and POLE (sequencing only); EPCAM and GREM1 (deletion/duplication only). RNA data is routinely analyzed for use in variant interpretation for all genes. The test report will be scanned into EPIC and located under the Molecular Pathology section of the Results Review tab.  A portion of the result report is included below for reference.     We discussed with Ms. Jeschke that because current genetic testing is not perfect, it is possible there may be a gene mutation in one of these genes that current testing cannot detect, but that chance is small.  We also discussed that there could be another gene that has not yet been discovered, or that we have not yet tested, that is responsible for the cancer diagnoses in the family. It is also possible there is a hereditary cause for the cancer in the family that Ms. Kirshenbaum did not inherit and therefore was not identified in her testing.  Therefore, it is important to remain in touch with cancer genetics in the future so that we can continue to offer Ms. Sangha the most up to date genetic testing.   Genetic testing did identify a variant of uncertain significance (VUS) in the ATM gene called p.R981H (c.2942G>A). At this time, it is unknown if this variant is associated with increased cancer risk or if this is a normal finding, but most variants such as this get reclassified to being inconsequential. It should not be used to make medical management decisions. With time, we  suspect the lab will determine the significance of this variant, if any. If we do learn more about it, we will try to contact Ms. Corne to discuss it further. However, it is important to stay in touch with [place] periodically and keep the address and phone number up to date.  CANCER  SCREENING RECOMMENDATIONS: Ms. Roderick test result is considered negative (normal).  This means that we have not identified a hereditary cause for her personal and family history of cancer at this time. While reassuring, this does not definitively rule out a hereditary predisposition to cancer. It is still possible that there could be genetic mutations that are undetectable by current technology. There could be genetic mutations in genes that have not been tested or identified to increase cancer risk. Therefore, it is recommended she continue to follow the cancer management and screening guidelines provided by her oncology and primary healthcare provider.   An individual's cancer risk and medical management are not determined by genetic test results alone. Overall cancer risk assessment incorporates additional factors, including personal medical history, family history, and any available genetic information that may result in a personalized plan for cancer prevention and surveillance.  RECOMMENDATIONS FOR FAMILY MEMBERS:  Individuals in this family might be at some increased risk of developing cancer, over the general population risk, simply due to the family history of cancer.  We recommended women in this family have a yearly mammogram beginning at age 25, or 43 years younger than the earliest onset of cancer, an annual clinical breast exam, and perform monthly breast self-exams. Women in this family should also have a gynecological exam as recommended by their primary provider. All family members should be referred for colonoscopy starting at age 39.  It is also possible there is a hereditary cause for the cancer in Ms. Orcutt's family that she did not inherit and therefore was not identified in her.  Based on Ms. Bacote's family history, we recommended her sister and maternal aunt, who have both had breast cancer, have genetic counseling and testing. Ms. Streight will let us know if we can be of any  assistance in coordinating genetic counseling and/or testing for these family members.   FOLLOW-UP: Lastly, we discussed with Ms. Hilton that cancer genetics is a rapidly advancing field and it is possible that new genetic tests will be appropriate for her and/or her family members in the future. We encouraged her to remain in contact with cancer genetics on an annual basis so we can update her personal and family histories and let her know of advances in cancer genetics that may benefit this family.   Our contact number was provided. Ms. Minteer questions were answered to her satisfaction, and she knows she is welcome to call us at anytime with additional questions or concerns.   Clint Guy, MS, [facility] Genetic Counselor [email or web address] Phone: [phone]

## 2021-02-26 NOTE — Telephone Encounter (Signed)
Disclosed negative genetic testing through the [facility] CancerNext-Expanded + RNAinsight panel. No additional genetic variants were detected.

## 2021-02-26 NOTE — Telephone Encounter (Signed)
LVM that results from her additional genetic testing are now available and requested that she call back to discuss them.

## 2021-02-27 ENCOUNTER — Other Ambulatory Visit: Payer: Self-pay

## 2021-02-27 ENCOUNTER — Encounter (HOSPITAL_BASED_OUTPATIENT_CLINIC_OR_DEPARTMENT_OTHER): Payer: Self-pay | Admitting: Surgery

## 2021-03-05 ENCOUNTER — Other Ambulatory Visit (HOSPITAL_COMMUNITY): Payer: 59

## 2021-03-06 ENCOUNTER — Other Ambulatory Visit: Payer: Self-pay

## 2021-03-06 ENCOUNTER — Ambulatory Visit
Admission: RE | Admit: 2021-03-06 | Discharge: 2021-03-06 | Disposition: A | Discharge status: Home / Self Care | Payer: 59 | Source: Ambulatory Visit | Attending: Surgery | Admitting: Surgery

## 2021-03-06 DIAGNOSIS — D0512 Intraductal carcinoma in situ of left breast: Secondary | ICD-10-CM

## 2021-03-08 NOTE — H&P (Signed)
Ranelle Oyster Appointment: 02/11/2021 1:00 PM Location: [facility] Patient #: [id number] DOB: [birth year] Undefined / Language: Cleophus Molt / Race: White Female   History of Present Illness Nathaneil Canary A. Ninfa Linden MD; 02/11/2021 2:51 PM) The patient is a 65 year old female who presents with breast cancer. 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 She underwent a biopsy of this showing ductal carcinoma in situ. The lesion measured 3 mm in size. It was ER positive, PR negative. She does have a family history of breast cancer. She has had no previous regarding her breast. He is otherwise healthy without complaints. She denies nipple discharge. She has no new cardiopulmonary issues.    Past Surgical History Conni Slipper, RN; 02/11/2021 8:17 AM) Breast Biopsy  Left. Cesarean Section - Multiple  Thyroid Surgery   Diagnostic Studies History Conni Slipper, RN; 02/11/2021 8:17 AM) Colonoscopy  1-5 years ago Mammogram  within last year Pap Smear  1-5 years ago  Medication History Conni Slipper, RN; 02/11/2021 8:17 AM) Medications Reconciled  Social History Conni Slipper, RN; 02/11/2021 8:17 AM) Alcohol use  Moderate alcohol use. Caffeine use  Coffee. No drug use  Tobacco use  Never smoker.  Family History Conni Slipper, RN; 02/11/2021 8:17 AM) Breast Cancer  Sister. Colon Cancer  Mother. Migraine Headache  Sister.  Pregnancy / Birth History Conni Slipper, RN; 02/11/2021 8:17 AM) Age at menarche  36 years. Age of menopause  19-60 Contraceptive History  Oral contraceptives. Gravida  0 Maternal age  <15 Para  0 Regular periods   Other Problems Conni Slipper, RN; 02/11/2021 8:17 AM) Breast Cancer  Hypercholesterolemia     Review of Systems Conni Slipper RN; 02/11/2021 8:17 AM) General Not Present- Appetite Loss, Chills,  Fatigue, Fever, Night Sweats, Weight Gain and Weight Loss. Skin Not Present- Change in Wart/Mole, Dryness, Hives, Jaundice, New Lesions, Non-Healing Wounds, Rash and Ulcer. HEENT Present- Wears glasses/contact lenses. Not Present- Earache, Hearing Loss, Hoarseness, Nose Bleed, Oral Ulcers, Ringing in the Ears, Seasonal Allergies, Sinus Pain, Sore Throat, Visual Disturbances and Yellow Eyes. Respiratory Not Present- Bloody sputum, Chronic Cough, Difficulty Breathing, Snoring and Wheezing. Breast Not Present- Breast Mass, Breast Pain, Nipple Discharge and Skin Changes. Cardiovascular Not Present- Chest Pain, Difficulty Breathing Lying Down, Leg Cramps, Palpitations, Rapid Heart Rate, Shortness of Breath and Swelling of Extremities. Gastrointestinal Not Present- Abdominal Pain, Bloating, Bloody Stool, Change in Bowel Habits, Chronic diarrhea, Constipation, Difficulty Swallowing, Excessive gas, Gets full quickly at meals, Hemorrhoids, Indigestion, Nausea, Rectal Pain and Vomiting. Female Genitourinary Not Present- Frequency, Nocturia, Painful Urination, Pelvic Pain and Urgency. Musculoskeletal Not Present- Back Pain, Joint Pain, Joint Stiffness, Muscle Pain, Muscle Weakness and Swelling of Extremities. Neurological Not Present- Decreased Memory, Fainting, Headaches, Numbness, Seizures, Tingling, Tremor, Trouble walking and Weakness. Psychiatric Not Present- Anxiety, Bipolar, Change in Sleep Pattern, Depression, Fearful and Frequent crying. Endocrine Not Present- Cold Intolerance, Excessive Hunger, Hair Changes, Heat Intolerance, Hot flashes and New Diabetes. Hematology Present- Easy Bruising. Not Present- Blood Thinners, Excessive bleeding, Gland problems, HIV and Persistent Infections.   Physical Exam (Autumn Gunn A. Ninfa Linden MD; 02/11/2021 2:52 PM) The physical exam findings are as follows: Note: She appears well on exam  There is mild ecchymosis and a tiny hematoma at the biopsy site of the left breast  but no other palpable masses. The nipple areolar complex is normal.  Lungs clear CV RRR Abdomen  soft, NT  There is no axillary adenopathy    Assessment & Plan  DUCTAL CARCINOMA IN SITU (DCIS) OF LEFT BREAST (D05.12)  Impression: In the electronic records. I have reviewed her mammograms, ultrasound, and results. We have discussed her this morning in our multidisciplinary breast cancer conference this morning. She has a left breast ductal carcinoma in situ. We discussed the diagnosis in detail. From a surgical standpoint we discussed breast conservation versus mastectomy. This is a very small focus of DCIS. Breast conservation is recommended and she agrees with this. I then discussed proceeding with a radioactive seed left breast lumpectomy. I explained this procedure in detail. We discussed the risks which includes but is not limited to bleeding infection, injury to surrounding structures, further procedures if the margins are positive, cardiopulmonary issues, etc. She understands and wishes to proceed with surgery which will be scheduled.

## 2021-03-09 ENCOUNTER — Other Ambulatory Visit: Payer: Self-pay

## 2021-03-09 ENCOUNTER — Encounter (HOSPITAL_BASED_OUTPATIENT_CLINIC_OR_DEPARTMENT_OTHER)
Admission: RE | Disposition: A | Discharge status: Home / Self Care | Payer: Self-pay | Source: Home / Self Care | Attending: Surgery

## 2021-03-09 ENCOUNTER — Ambulatory Visit (HOSPITAL_BASED_OUTPATIENT_CLINIC_OR_DEPARTMENT_OTHER): Payer: 59 | Admitting: Anesthesiology

## 2021-03-09 ENCOUNTER — Ambulatory Visit (HOSPITAL_BASED_OUTPATIENT_CLINIC_OR_DEPARTMENT_OTHER)
Admission: RE | Admit: 2021-03-09 | Discharge: 2021-03-09 | Disposition: A | Discharge status: Home / Self Care | Payer: 59 | Attending: Surgery | Admitting: Surgery

## 2021-03-09 ENCOUNTER — Encounter (HOSPITAL_BASED_OUTPATIENT_CLINIC_OR_DEPARTMENT_OTHER): Payer: Self-pay | Admitting: Surgery

## 2021-03-09 ENCOUNTER — Ambulatory Visit
Admission: RE | Admit: 2021-03-09 | Discharge: 2021-03-09 | Disposition: A | Discharge status: Home / Self Care | Payer: 59 | Source: Ambulatory Visit | Attending: Surgery | Admitting: Surgery

## 2021-03-09 DIAGNOSIS — N6082 Other benign mammary dysplasias of left breast: Secondary | ICD-10-CM | POA: Insufficient documentation

## 2021-03-09 DIAGNOSIS — D0512 Intraductal carcinoma in situ of left breast: Secondary | ICD-10-CM | POA: Insufficient documentation

## 2021-03-09 HISTORY — PX: BREAST LUMPECTOMY WITH RADIOACTIVE SEED LOCALIZATION: SHX6424

## 2021-03-09 HISTORY — DX: Pure hypercholesterolemia, unspecified: E78.00

## 2021-03-09 SURGERY — BREAST LUMPECTOMY WITH RADIOACTIVE SEED LOCALIZATION
Anesthesia: General | Site: Breast | Laterality: Left

## 2021-03-09 MED ORDER — ACETAMINOPHEN 500 MG PO TABS
1000.0000 mg | ORAL_TABLET | ORAL | Status: AC
  Administered 2021-03-09: 1000 mg via ORAL

## 2021-03-09 MED ORDER — EPHEDRINE 5 MG/ML INJ
INTRAVENOUS | Status: AC
  Filled 2021-03-09: qty 10

## 2021-03-09 MED ORDER — ONDANSETRON HCL 4 MG/2ML IJ SOLN
INTRAMUSCULAR | Status: DC | PRN
  Administered 2021-03-09: 4 mg via INTRAVENOUS

## 2021-03-09 MED ORDER — CHLORHEXIDINE GLUCONATE CLOTH 2 % EX PADS: 6.0000 | MEDICATED_PAD | Freq: Once | CUTANEOUS | Status: DC

## 2021-03-09 MED ORDER — BUPIVACAINE-EPINEPHRINE 0.5% -1:200000 IJ SOLN
INTRAMUSCULAR | Status: DC | PRN
  Administered 2021-03-09: 15 mL

## 2021-03-09 MED ORDER — TRAMADOL HCL 50 MG PO TABS: 50.0000 mg | ORAL_TABLET | Freq: Four times a day (QID) | ORAL | 0 refills | Status: DC | PRN

## 2021-03-09 MED ORDER — CIPROFLOXACIN IN D5W 400 MG/200ML IV SOLN
400.0000 mg | INTRAVENOUS | Status: AC
  Administered 2021-03-09: 400 mg via INTRAVENOUS

## 2021-03-09 MED ORDER — LIDOCAINE HCL (CARDIAC) PF 100 MG/5ML IV SOSY
PREFILLED_SYRINGE | INTRAVENOUS | Status: DC | PRN
  Administered 2021-03-09: 50 mg via INTRATRACHEAL

## 2021-03-09 MED ORDER — MIDAZOLAM HCL 5 MG/5ML IJ SOLN
INTRAMUSCULAR | Status: DC | PRN
  Administered 2021-03-09: 2 mg via INTRAVENOUS

## 2021-03-09 MED ORDER — OXYCODONE HCL 5 MG PO TABS: 5.0000 mg | ORAL_TABLET | Freq: Once | ORAL | Status: DC | PRN

## 2021-03-09 MED ORDER — FENTANYL CITRATE (PF) 100 MCG/2ML IJ SOLN: 25.0000 ug | INTRAMUSCULAR | Status: DC | PRN

## 2021-03-09 MED ORDER — OXYCODONE HCL 5 MG/5ML PO SOLN: 5.0000 mg | Freq: Once | ORAL | Status: DC | PRN

## 2021-03-09 MED ORDER — MIDAZOLAM HCL 2 MG/2ML IJ SOLN
INTRAMUSCULAR | Status: AC
  Filled 2021-03-09: qty 2

## 2021-03-09 MED ORDER — ONDANSETRON HCL 4 MG/2ML IJ SOLN: 4.0000 mg | Freq: Once | INTRAMUSCULAR | Status: DC | PRN

## 2021-03-09 MED ORDER — FENTANYL CITRATE (PF) 100 MCG/2ML IJ SOLN
INTRAMUSCULAR | Status: DC | PRN
  Administered 2021-03-09: 50 ug via INTRAVENOUS
  Administered 2021-03-09 (×2): 25 ug via INTRAVENOUS

## 2021-03-09 MED ORDER — DEXAMETHASONE SODIUM PHOSPHATE 10 MG/ML IJ SOLN
INTRAMUSCULAR | Status: DC | PRN
  Administered 2021-03-09: 5 mg via INTRAVENOUS

## 2021-03-09 MED ORDER — PROPOFOL 10 MG/ML IV BOLUS
INTRAVENOUS | Status: AC
  Filled 2021-03-09: qty 20

## 2021-03-09 MED ORDER — PROPOFOL 10 MG/ML IV BOLUS
INTRAVENOUS | Status: DC | PRN
  Administered 2021-03-09: 140 mg via INTRAVENOUS

## 2021-03-09 MED ORDER — ONDANSETRON HCL 4 MG/2ML IJ SOLN
INTRAMUSCULAR | Status: AC
  Filled 2021-03-09: qty 2

## 2021-03-09 MED ORDER — ACETAMINOPHEN 500 MG PO TABS
ORAL_TABLET | ORAL | Status: AC
  Filled 2021-03-09: qty 2

## 2021-03-09 MED ORDER — FENTANYL CITRATE (PF) 100 MCG/2ML IJ SOLN
INTRAMUSCULAR | Status: AC
  Filled 2021-03-09: qty 2

## 2021-03-09 MED ORDER — CIPROFLOXACIN IN D5W 400 MG/200ML IV SOLN
INTRAVENOUS | Status: AC
  Filled 2021-03-09: qty 200

## 2021-03-09 MED ORDER — LIDOCAINE HCL (PF) 2 % IJ SOLN
INTRAMUSCULAR | Status: AC
  Filled 2021-03-09: qty 5

## 2021-03-09 MED ORDER — EPHEDRINE SULFATE 50 MG/ML IJ SOLN
INTRAMUSCULAR | Status: DC | PRN
  Administered 2021-03-09: 5 mg via INTRAVENOUS

## 2021-03-09 MED ORDER — DEXAMETHASONE SODIUM PHOSPHATE 10 MG/ML IJ SOLN
INTRAMUSCULAR | Status: AC
  Filled 2021-03-09: qty 1

## 2021-03-09 MED ORDER — LACTATED RINGERS IV SOLN: INTRAVENOUS | Status: DC

## 2021-03-09 SURGICAL SUPPLY — 51 items
ADH SKN CLS APL DERMABOND .7 (GAUZE/BANDAGES/DRESSINGS) ×1
APL PRP STRL LF DISP 70% ISPRP (MISCELLANEOUS) ×1
APPLIER CLIP 9.375 MED OPEN (MISCELLANEOUS)
APR CLP MED 9.3 20 MLT OPN (MISCELLANEOUS)
BINDER BREAST 3XL (GAUZE/BANDAGES/DRESSINGS) IMPLANT
BINDER BREAST LRG (GAUZE/BANDAGES/DRESSINGS) IMPLANT
BINDER BREAST MEDIUM (GAUZE/BANDAGES/DRESSINGS) IMPLANT
BINDER BREAST XLRG (GAUZE/BANDAGES/DRESSINGS) IMPLANT
BINDER BREAST XXLRG (GAUZE/BANDAGES/DRESSINGS) IMPLANT
BLADE SURG 15 STRL LF DISP TIS (BLADE) ×1 IMPLANT
BLADE SURG 15 STRL SS (BLADE) ×2
CANISTER SUC SOCK COL 7IN (MISCELLANEOUS) IMPLANT
CANISTER SUCT 1200ML W/VALVE (MISCELLANEOUS) IMPLANT
CHLORAPREP W/TINT 26 (MISCELLANEOUS) ×2 IMPLANT
CLIP APPLIE 9.375 MED OPEN (MISCELLANEOUS) IMPLANT
COVER BACK TABLE 60X90IN (DRAPES) ×2 IMPLANT
COVER MAYO STAND STRL (DRAPES) ×2 IMPLANT
COVER PROBE W GEL 5X96 (DRAPES) ×2 IMPLANT
COVER WAND RF STERILE (DRAPES) IMPLANT
DECANTER SPIKE VIAL GLASS SM (MISCELLANEOUS) IMPLANT
DERMABOND ADVANCED (GAUZE/BANDAGES/DRESSINGS) ×1
DERMABOND ADVANCED .7 DNX12 (GAUZE/BANDAGES/DRESSINGS) ×1 IMPLANT
DRAPE LAPAROSCOPIC ABDOMINAL (DRAPES) ×2 IMPLANT
DRAPE UTILITY XL STRL (DRAPES) ×2 IMPLANT
ELECT REM PT RETURN 9FT ADLT (ELECTROSURGICAL) ×2
ELECTRODE REM PT RTRN 9FT ADLT (ELECTROSURGICAL) ×1 IMPLANT
GAUZE SPONGE 4X4 12PLY STRL LF (GAUZE/BANDAGES/DRESSINGS) IMPLANT
GLOVE SURG ORTHO LTX SZ9 (GLOVE) ×1 IMPLANT
GLOVE SURG POLYISO LF SZ7 (GLOVE) ×1 IMPLANT
GLOVE SURG SIGNA 7.5 PF LTX (GLOVE) ×2 IMPLANT
GOWN STRL REUS W/ TWL LRG LVL3 (GOWN DISPOSABLE) ×1 IMPLANT
GOWN STRL REUS W/ TWL XL LVL3 (GOWN DISPOSABLE) ×1 IMPLANT
GOWN STRL REUS W/TWL LRG LVL3 (GOWN DISPOSABLE) ×2
GOWN STRL REUS W/TWL XL LVL3 (GOWN DISPOSABLE) ×4
KIT MARKER MARGIN INK (KITS) ×2 IMPLANT
NDL HYPO 25X1 1.5 SAFETY (NEEDLE) ×1 IMPLANT
NEEDLE HYPO 25X1 1.5 SAFETY (NEEDLE) ×2 IMPLANT
NS IRRIG 1000ML POUR BTL (IV SOLUTION) IMPLANT
PACK BASIN DAY SURGERY FS (CUSTOM PROCEDURE TRAY) ×2 IMPLANT
PENCIL SMOKE EVACUATOR (MISCELLANEOUS) ×2 IMPLANT
SLEEVE SCD COMPRESS KNEE MED (STOCKING) ×2 IMPLANT
SPONGE LAP 4X18 RFD (DISPOSABLE) ×2 IMPLANT
SUT MNCRL AB 4-0 PS2 18 (SUTURE) ×2 IMPLANT
SUT SILK 2 0 SH (SUTURE) IMPLANT
SUT VIC AB 3-0 SH 27 (SUTURE) ×4
SUT VIC AB 3-0 SH 27X BRD (SUTURE) ×1 IMPLANT
SYR CONTROL 10ML LL (SYRINGE) ×2 IMPLANT
TOWEL GREEN STERILE FF (TOWEL DISPOSABLE) ×2 IMPLANT
TRAY FAXITRON CT DISP (TRAY / TRAY PROCEDURE) ×2 IMPLANT
TUBE CONNECTING 20X1/4 (TUBING) IMPLANT
YANKAUER SUCT BULB TIP NO VENT (SUCTIONS) IMPLANT

## 2021-03-09 NOTE — Interval H&P Note (Signed)
History and Physical Interval Note: no change in H and P  03/09/2021 8:52 AM  Amanda Wolf  has presented today for surgery, with the diagnosis of LEFT BREAST DCIS.  The various methods of treatment have been discussed with the patient and family. After consideration of risks, benefits and other options for treatment, the patient has consented to  Procedure(s): LEFT BREAST LUMPECTOMY WITH RADIOACTIVE SEED LOCALIZATION (Left) as a surgical intervention.  The patient's history has been reviewed, patient examined, no change in status, stable for surgery.  I have reviewed the patient's chart and labs.  Questions were answered to the patient's satisfaction.     Coralie Keens

## 2021-03-09 NOTE — Anesthesia Preprocedure Evaluation (Addendum)
Anesthesia Evaluation  Patient identified by MRN, date of birth, ID band Patient awake    Reviewed: Allergy & Precautions, NPO status , Patient's Chart, lab work & pertinent test results  Airway Mallampati: II  TM Distance: >3 FB Neck ROM: Full    Dental no notable dental hx. (+) Teeth Intact, Dental Advisory Given   Pulmonary neg pulmonary ROS,    Pulmonary exam normal breath sounds clear to auscultation       Cardiovascular negative cardio ROS Normal cardiovascular exam Rhythm:Regular Rate:Normal     Neuro/Psych negative neurological ROS  negative psych ROS   GI/Hepatic negative GI ROS, Neg liver ROS,   Endo/Other  Left Breast DCIS Hypercholesterolemia- on no Rx  Renal/GU negative Renal ROS  negative genitourinary   Musculoskeletal Osteopenia   Abdominal   Peds  Hematology negative hematology ROS (+)   Anesthesia Other Findings   Reproductive/Obstetrics                            Anesthesia Physical Anesthesia Plan  ASA: II  Anesthesia Plan: General   Post-op Pain Management:    Induction: Intravenous  PONV Risk Score and Plan: 4 or greater and Treatment may vary due to age or medical condition, Midazolam, Ondansetron and Dexamethasone  Airway Management Planned: LMA  Additional Equipment:   Intra-op Plan:   Post-operative Plan: Extubation in OR  Informed Consent: I have reviewed the patients History and Physical, chart, labs and discussed the procedure including the risks, benefits and alternatives for the proposed anesthesia with the patient or authorized representative who has indicated his/her understanding and acceptance.     Dental advisory given  Plan Discussed with: CRNA and Anesthesiologist  Anesthesia Plan Comments:        Anesthesia Quick Evaluation

## 2021-03-09 NOTE — Transfer of Care (Signed)
Immediate Anesthesia Transfer of Care Note  Patient: [name]  Procedure(s) Performed: LEFT BREAST LUMPECTOMY WITH RADIOACTIVE SEED LOCALIZATION (Left Breast)  Patient Location: PACU  Anesthesia Type:General  Level of Consciousness: drowsy and patient cooperative  Airway & Oxygen Therapy: Patient Spontanous Breathing and Patient connected to face mask oxygen  Post-op Assessment: Report given to RN and Post -op Vital signs reviewed and stable  Post vital signs: Reviewed and stable  Last Vitals:  Vitals Value Taken Time  BP 100/62 03/09/21 1033  Temp    Pulse 60 03/09/21 1034  Resp 12 03/09/21 1034  SpO2 99 % 03/09/21 1034  Vitals shown include unvalidated device data.  Last Pain:  Vitals:   03/09/21 0854  TempSrc: Oral  PainSc: 0-No pain         Complications: No complications documented.

## 2021-03-09 NOTE — Op Note (Addendum)
LEFT BREAST LUMPECTOMY WITH RADIOACTIVE SEED LOCALIZATION  Procedure Note  BEVELYN ARRIOLA 03/09/2021   Pre-op Diagnosis: LEFT BREAST DCIS     Post-op Diagnosis: same  Procedure(s): LEFT BREAST LUMPECTOMY WITH RADIOACTIVE SEED LOCALIZATION  Surgeon(s): Coralie Keens, MD  Mosie Lukes, MD ([facility] Resident)   Anesthesia: General  Staff:  Circulator: Charisse March, RN; Izora Ribas, RN Scrub Person: Murvin Natal  Estimated Blood Loss: Minimal               Specimens: sent to path  Indications: This is a 65 year old female was found to have small calcifications in the left breast on screening mammography.  She underwent a stereotactic biopsy showing DCIS.  On preoperative x-rays prior to the biopsy the area of calcification was only 0.3 cm.  The decision was made to proceed with a radioactive seed guided left breast lumpectomy  Procedure: The patient is brought to the operating room identifies correct patient.  She is placed upon the operating table general anesthesia was induced.  Her left breast was prepped and draped in usual sterile fashion.  The radioactive seed was located in the 3 o'clock position of the left breast laterally.  I anesthetized skin around the edge of the areola and made a circumareolar incision with a scalpel.  I then dissected down to the breast tissue with electrocautery.  With the aid of neoprobe I then dissected laterally and posteriorly toward the radioactive seed.  I then reached the area of the seed with the neoprobe and performed a lumpectomy staying around the radioactive seed with the cautery.  Once the specimen was removed I marked all margins with paint.  An x-ray was performed on specimen confirming the previous biopsy clip and the radioactive seed were in the specimen.  The lumpectomy specimen was then sent to pathology for evaluation.  Hemostasis was then achieved with cautery.  We anesthetized the incision further with Marcaine.  The  subcutaneous tissue was then closed with interrupted 3-0 Vicryl sutures and the skin was closed with running 4-0 Monocryl.  Dermabond was then applied.  The patient tolerated the procedure well.  All the counts were correct at the end of the procedure.  The patient was then extubated in the operating room and taken in a stable condition to the recovery room.          Coralie Keens   Date: 03/09/2021  Time: 10:30 AM

## 2021-03-09 NOTE — Anesthesia Procedure Notes (Signed)
Procedure Name: LMA Insertion Date/Time: 03/09/2021 9:57 AM Performed by: Glory Buff, CRNA Pre-anesthesia Checklist: Patient identified, Emergency Drugs available, Suction available and Patient being monitored Patient Re-evaluated:Patient Re-evaluated prior to induction Oxygen Delivery Method: Circle system utilized Preoxygenation: Pre-oxygenation with 100% oxygen Induction Type: IV induction LMA: LMA inserted LMA Size: 4.0 Number of attempts: 1 Placement Confirmation: positive ETCO2 Tube secured with: Tape Dental Injury: Teeth and Oropharynx as per pre-operative assessment

## 2021-03-09 NOTE — Anesthesia Postprocedure Evaluation (Signed)
Anesthesia Post Note  Patient: Amanda Wolf  Procedure(s) Performed: LEFT BREAST LUMPECTOMY WITH RADIOACTIVE SEED LOCALIZATION (Left Breast)     Patient location during evaluation: PACU Anesthesia Type: General Level of consciousness: awake and alert and oriented Pain management: pain level controlled Vital Signs Assessment: post-procedure vital signs reviewed and stable Respiratory status: spontaneous breathing, nonlabored ventilation and respiratory function stable Cardiovascular status: blood pressure returned to baseline and stable Postop Assessment: no apparent nausea or vomiting Anesthetic complications: no   No complications documented.  Last Vitals:  Vitals:   03/09/21 1033 03/09/21 1045  BP: 100/62 105/74  Pulse: 62 66  Resp: 12 13  Temp: (!) 36.4 C   SpO2: 92% 98%    Last Pain:  Vitals:   03/09/21 1033  TempSrc:   PainSc: Asleep                 Garik Diamant A.

## 2021-03-09 NOTE — Discharge Instructions (Signed)
[facility] Office Phone Number [phone]  BREAST BIOPSY/ PARTIAL MASTECTOMY: POST OP INSTRUCTIONS  Always review your discharge instruction sheet given to you by the facility where your surgery was performed.  IF YOU HAVE DISABILITY OR FAMILY LEAVE FORMS, YOU MUST BRING THEM TO THE OFFICE FOR PROCESSING.  DO NOT GIVE THEM TO YOUR DOCTOR.  1. A prescription for pain medication may be given to you upon discharge.  Take your pain medication as prescribed, if needed.  If narcotic pain medicine is not needed, then you may take acetaminophen (Tylenol) or ibuprofen (Advil) as needed. 2. Take your usually prescribed medications unless otherwise directed 3. If you need a refill on your pain medication, please contact your pharmacy.  They will contact our office to request authorization.  Prescriptions will not be filled after 5pm or on week-ends. 4. You should eat very light the first 24 hours after surgery, such as soup, crackers, pudding, etc.  Resume your normal diet the day after surgery. 5. Most patients will experience some swelling and bruising in the breast.  Ice packs and a good support bra will help.  Swelling and bruising can take several days to resolve.  6. It is common to experience some constipation if taking pain medication after surgery.  Increasing fluid intake and taking a stool softener will usually help or prevent this problem from occurring.  A mild laxative (Milk of Magnesia or Miralax) should be taken according to package directions if there are no bowel movements after 48 hours. 7. Unless discharge instructions indicate otherwise, you may remove your bandages 24-48 hours after surgery, and you may shower at that time.  You may have steri-strips (small skin tapes) in place directly over the incision.  These strips should be left on the skin for 7-10 days.  If your surgeon used skin glue on the incision, you may shower in 24 hours.  The glue will flake off over the  next 2-3 weeks.  Any sutures or staples will be removed at the office during your follow-up visit. 8. ACTIVITIES:  You may resume regular daily activities (gradually increasing) beginning the next day.  Wearing a good support bra or sports bra minimizes pain and swelling.  You may have sexual intercourse when it is comfortable. a. You may drive when you no longer are taking prescription pain medication, you can comfortably wear a seatbelt, and you can safely maneuver your car and apply brakes. b. RETURN TO WORK:  ______________________________________________________________________________________ 9. You should see your doctor in the office for a follow-up appointment approximately two weeks after your surgery.  Your doctor's nurse will typically make your follow-up appointment when she calls you with your pathology report.  Expect your pathology report 2-3 business days after your surgery.  You may call to check if you do not hear from us after three days. 10. OTHER INSTRUCTIONS:OK TO SHOWER STARTING TOMORROW 11. ICE PACK, TYLENOL, AND IBUPROFEN ALSO FOR PAIN 12. NO VIGOROUS ACTIVITY FOR ONE WEEK _______________________________________________________________________________________________ _____________________________________________________________________________________________________________________________________ _____________________________________________________________________________________________________________________________________ _____________________________________________________________________________________________________________________________________  WHEN TO CALL YOUR DOCTOR: 1. Fever over 101.0 2. Nausea and/or vomiting. 3. Extreme swelling or bruising. 4. Continued bleeding from incision. 5. Increased pain, redness, or drainage from the incision.  The clinic staff is available to answer your questions during regular business hours.  Please don't hesitate to  call and ask to speak to one of the nurses for clinical concerns.  If you have a medical emergency, go to the nearest emergency room or call 911.  A   surgeon from [facility] is always on call at the hospital.  For further questions, please visit [email or web address]    Post Anesthesia Home Care Instructions  Activity: Get plenty of rest for the remainder of the day. A responsible individual must stay with you for 24 hours following the procedure.  For the next 24 hours, DO NOT: -Drive a car -Paediatric nurse -Drink alcoholic beverages -Take any medication unless instructed by your physician -Make any legal decisions or sign important papers.  Meals: Start with liquid foods such as gelatin or soup. Progress to regular foods as tolerated. Avoid greasy, spicy, heavy foods. If nausea and/or vomiting occur, drink only clear liquids until the nausea and/or vomiting subsides. Call your physician if vomiting continues.  Special Instructions/Symptoms: Your throat may feel dry or sore from the anesthesia or the breathing tube placed in your throat during surgery. If this causes discomfort, gargle with warm salt water. The discomfort should disappear within 24 hours.  If you had a scopolamine patch placed behind your ear for the management of post- operative nausea and/or vomiting:  1. The medication in the patch is effective for 72 hours, after which it should be removed.  Wrap patch in a tissue and discard in the trash. Wash hands thoroughly with soap and water. 2. You may remove the patch earlier than 72 hours if you experience unpleasant side effects which may include dry mouth, dizziness or visual disturbances. 3. Avoid touching the patch. Wash your hands with soap and water after contact with the patch.

## 2021-03-10 ENCOUNTER — Encounter (HOSPITAL_BASED_OUTPATIENT_CLINIC_OR_DEPARTMENT_OTHER): Payer: Self-pay | Admitting: Surgery

## 2021-03-11 ENCOUNTER — Other Ambulatory Visit: Payer: Self-pay | Admitting: Surgery

## 2021-03-11 LAB — SURGICAL PATHOLOGY

## 2021-03-12 ENCOUNTER — Ambulatory Visit: Payer: 59 | Admitting: Radiation Oncology

## 2021-03-12 ENCOUNTER — Ambulatory Visit: Payer: 59

## 2021-03-12 ENCOUNTER — Encounter: Payer: Self-pay | Admitting: *Deleted

## 2021-03-12 ENCOUNTER — Other Ambulatory Visit: Payer: Self-pay

## 2021-03-12 ENCOUNTER — Encounter: Payer: Self-pay | Admitting: Radiation Oncology

## 2021-03-12 ENCOUNTER — Ambulatory Visit
Admission: RE | Admit: 2021-03-12 | Discharge: 2021-03-12 | Disposition: A | Discharge status: Home / Self Care | Payer: 59 | Source: Ambulatory Visit | Attending: Radiation Oncology | Admitting: Radiation Oncology

## 2021-03-12 VITALS — BP 112/77 | HR 70 | Resp 18 | Wt 129.2 lb

## 2021-03-12 DIAGNOSIS — Z800 Family history of malignant neoplasm of digestive organs: Secondary | ICD-10-CM | POA: Insufficient documentation

## 2021-03-12 DIAGNOSIS — Z170 Estrogen receptor positive status [ER+]: Secondary | ICD-10-CM | POA: Insufficient documentation

## 2021-03-12 DIAGNOSIS — Z79899 Other long term (current) drug therapy: Secondary | ICD-10-CM | POA: Insufficient documentation

## 2021-03-12 DIAGNOSIS — M8580 Other specified disorders of bone density and structure, unspecified site: Secondary | ICD-10-CM | POA: Insufficient documentation

## 2021-03-12 DIAGNOSIS — E7800 Pure hypercholesterolemia, unspecified: Secondary | ICD-10-CM | POA: Insufficient documentation

## 2021-03-12 DIAGNOSIS — Z803 Family history of malignant neoplasm of breast: Secondary | ICD-10-CM | POA: Insufficient documentation

## 2021-03-12 DIAGNOSIS — D0512 Intraductal carcinoma in situ of left breast: Secondary | ICD-10-CM | POA: Insufficient documentation

## 2021-03-12 NOTE — Progress Notes (Addendum)
Patient states that she does not have a Psychologist, forensic . Patient denies being pregnant. Patient denies having radiation. Vitals:   03/12/21 1337  BP: 112/77  Pulse: 70  Resp: 18  SpO2: 100%  Weight: 58.6 kg

## 2021-03-13 ENCOUNTER — Institutional Professional Consult (permissible substitution): Payer: 59 | Admitting: Radiation Oncology

## 2021-03-13 NOTE — Progress Notes (Signed)
Radiation Oncology         [phone]) [phone] ________________________________  Name: Amanda Wolf        MRN: [id number]  Date of Service: 03/12/2021 DOB: Jul 05, 1956  BT:DVVOHY, Mancel Bale, PA  Truitt Merle, MD     REFERRING PHYSICIAN: Truitt Merle, MD   DIAGNOSIS: The encounter diagnosis was Ductal carcinoma in situ (DCIS) of left breast.   HISTORY OF PRESENT ILLNESS: Amanda Wolf is a 65 y.o. female originally seen in the multidisciplinary breast clinic for a new diagnosis of left breast cancer. The patient was noted to have screening detected calcifications in the left breast in the upper outer quadrant. The calcifications measured 3 mm. No ultrasound was indicated and stereotactic biopsy revealed a high grade DCIS with calcifications and necrosis. Her cancer was ER positive, PR negative.  She has since undergone lumpectomy which revealed a 7 mm DCIS that was  intermediate grade.  Carcinoma involves the inked lateral margin fibrocystic changes were also seen as well as prior biopsy site.  She is planning to undergo reexcision of her margins.  She had originally desired an appointment to discuss the utility of Oncotype DX score for DCIS, and comes today to the adjuvant radiotherapy.  PREVIOUS RADIATION THERAPY: No   PAST MEDICAL HISTORY:  Past Medical History:  Diagnosis Date   Cancer (Amanda Wolf) 02/2021   left breast DCIS   Family history of breast cancer    Family history of colon cancer    Hypercholesteremia    Osteopenia        PAST SURGICAL HISTORY: Past Surgical History:  Procedure Laterality Date   BREAST BIOPSY Left 1980   BREAST LUMPECTOMY WITH RADIOACTIVE SEED LOCALIZATION Left 03/09/2021   Procedure: LEFT BREAST LUMPECTOMY WITH RADIOACTIVE SEED LOCALIZATION;  Surgeon: Coralie Keens, MD;  Location: [place];  Service: General;  Laterality: Left;   uterine cyst  1990     FAMILY HISTORY:  Family History  Problem Relation Age of Onset   Colon cancer  Mother 37   Breast cancer Maternal Aunt 54   Breast cancer Maternal Grandmother 56   Breast cancer Sister 33       breast cancer    Stroke Maternal Grandfather    Skin cancer Cousin        maternal first cousin     SOCIAL HISTORY:  reports that she has never smoked. She has never used smokeless tobacco. She reports current alcohol use of about 14.0 standard drinks of alcohol per week. She reports that she does not use drugs. The patient is single and lives in [place]. She is retired from working in [employer] under[employer]. She is friends and neighbors with another patient of Dr. Ida Rogue. She enjoys dancing, volunteering, and horseback riding.   ALLERGIES: Indomethacin, Sulfonamide derivatives, and Penicillins   MEDICATIONS:  Current Outpatient Medications  Medication Sig Dispense Refill   calcium carbonate (OS-CAL) 600 MG TABS tablet Take by mouth. (Patient not taking: Reported on 03/12/2021)     cholecalciferol (VITAMIN D3) 25 MCG (1000 UNIT) tablet Take 1,000 Units by mouth daily.     CINNAMON PO Take by mouth daily.     co-enzyme Q-10 30 MG capsule Take 30 mg by mouth 3 (three) times daily.     EVENING PRIMROSE OIL PO Take by mouth daily.     Flaxseed, Linseed, (FLAXSEED OIL PO) Take by mouth daily.     MAGNESIUM PO Take by mouth daily.     Milk Thistle 200 MG  CAPS See admin instructions.     Multiple Vitamins-Minerals (IMMUNE SYSTEM BOOSTER PO) Take by mouth.     Red Yeast Rice 600 MG CAPS Take by mouth.     rosuvastatin (CRESTOR) 10 MG tablet Take 5 mg by mouth daily.     traMADol (ULTRAM) 50 MG tablet Take 1 tablet (50 mg total) by mouth every 6 (six) hours as needed. (Patient not taking: Reported on 03/12/2021) 20 tablet 0   No current facility-administered medications for this encounter.     REVIEW OF SYSTEMS: On review of systems, the patient states she is doing well overall.  She denies any breast related concerns regarding healing.     PHYSICAL EXAM:  Wt Readings from Last 3  Encounters:  03/12/21 129 lb 4 oz (58.6 kg)  03/09/21 127 lb 6.8 oz (57.8 kg)  02/11/21 132 lb 3.2 oz (60 kg)   Temp Readings from Last 3 Encounters:  03/09/21 97.7 F (36.5 C) (Oral)  02/11/21 97.9 F (36.6 C) (Tympanic)  03/12/16 97.7 F (36.5 C)   BP Readings from Last 3 Encounters:  03/12/21 112/77  03/09/21 111/76  02/11/21 (!) 144/80   Pulse Readings from Last 3 Encounters:  03/12/21 70  03/09/21 63  02/11/21 84    In general this is a well appearing caucasian female in no acute distress. She's alert and oriented x4 and appropriate throughout the examination. Cardiopulmonary assessment is negative for acute distress and she exhibits normal effort. Bilateral breast exam is deferred.    ECOG = 0  0 - Asymptomatic (Fully active, able to carry on all predisease activities without restriction)  1 - Symptomatic but completely ambulatory (Restricted in physically strenuous activity but ambulatory and able to carry out work of a light or sedentary nature. For example, light housework, office work)  2 - Symptomatic, <50% in bed during the day (Ambulatory and capable of all self care but unable to carry out any work activities. Up and about more than 50% of waking hours)  3 - Symptomatic, >50% in bed, but not bedbound (Capable of only limited self-care, confined to bed or chair 50% or more of waking hours)  4 - Bedbound (Completely disabled. Cannot carry on any self-care. Totally confined to bed or chair)  5 - Death   Eustace Pen MM, Creech RH, Tormey DC, et al. [phone]). "Toxicity and response criteria of the [facility]". [place] Oncol. 5 (6): 649-55    LABORATORY DATA:  Lab Results  Component Value Date   WBC 7.2 02/11/2021   HGB 13.6 02/11/2021   HCT 40.0 02/11/2021   MCV 97.3 02/11/2021   PLT 282 02/11/2021   Lab Results  Component Value Date   NA 141 02/11/2021   K 4.3 02/11/2021   CL 101 02/11/2021   CO2 28 02/11/2021   Lab Results   Component Value Date   ALT 23 02/11/2021   AST 36 02/11/2021   ALKPHOS 90 02/11/2021   BILITOT 0.6 02/11/2021      RADIOGRAPHY: MM Breast Surgical Specimen  Result Date: 03/09/2021 CLINICAL DATA:  65 year old with biopsy-proven high-grade DCIS involving the OUTER LEFT breast. Radioactive seed localization was performed 03/06/2021 in anticipation of today's lumpectomy. EXAM: SPECIMEN RADIOGRAPH OF THE LEFT BREAST COMPARISON:  Previous exam(s). FINDINGS: Status post excision of the LEFT breast. The radioactive seed and the coil shaped biopsy marker clip are present in the non-compressed specimen. The seed is intact. This was discussed directly with the operating room nurse  at the time of interpretation on 03/09/2021 at 10:30 a.m. IMPRESSION: Specimen radiograph of the LEFT breast. Electronically Signed   By: Evangeline Dakin M.D.   On: 03/09/2021 10:35   MM LT RADIOACTIVE SEED LOC MAMMO GUIDE  Result Date: 03/06/2021 CLINICAL DATA:  65 year old with biopsy-proven high-grade DCIS involving the OUTER LEFT breast. Radioactive seed localization is performed in anticipation of lumpectomy which is scheduled for 03/10/2019. EXAM: MAMMOGRAPHIC GUIDED RADIOACTIVE SEED LOCALIZATION OF THE LEFT BREAST COMPARISON:  Previous exam(s). FINDINGS: Patient presents for radioactive seed localization prior to LEFT breast lumpectomy. I met with the patient and we discussed the procedure of seed localization including benefits and alternatives. We discussed the high likelihood of a successful procedure. We discussed the risks of the procedure including infection, bleeding, tissue injury and further surgery. We discussed the low dose of radioactivity involved in the procedure. Informed, written consent was given. The usual time-out protocol was performed immediately prior to the procedure. Using mammographic guidance, sterile technique with chlorhexidine as skin antisepsis, 1% lidocaine as local anesthetic, an I-125  radioactive seed was used to localize the coil shaped tissue marker clip associated with the biopsy-proven DCIS in the outer breast using a lateral approach. The follow-up mammogram images confirm the seed is appropriately positioned adjacent to the clip, located 5 mm inferior to the clip. The images are marked for Dr. Ninfa Linden. Follow-up survey of the patient confirms the presence of the radioactive seed. Order number of I-125 seed: [id number] Total activity: 0.257 mCi Reference Date: 02/20/2021 The patient tolerated the procedure well and was released from the [facility]. She was given instructions regarding seed removal. IMPRESSION: Radioactive seed localization of biopsy-proven DCIS involving the outer LEFT breast breast. No apparent complications. Electronically Signed   By: Evangeline Dakin M.D.   On: 03/06/2021 16:34        IMPRESSION/PLAN: 1. Intermediate grade, ER  Positive DCIS of the left breast. Dr. Lisbeth Renshaw discusses the final pathology findings and reviews the nature of noninvasive breast disease.  Dr. Lisbeth Renshaw is in agreement that for reducing long-term risks of recurrence, margins should be reexcised.  She is working with Dr. Trevor Mace schedulers to coordinate this.  Dr. Lisbeth Renshaw discusses the benefits and limitations of certain clinical trials that are shown utility in Oncotype DX score for DCIS.  Rather today we focused more so on nomogram for DCIS through [facility] program on risks of recurrence based on 10 variables, taking into account her current margin status, and expected margin status following a second surgery. Given the statistical benefit to reduce risks of recurrence, Dr. Lisbeth Renshaw offers external radiotherapy to the breast followed by antiestrogen therapy. We discussed the risks, benefits, short, and long term effects of radiotherapy, as well as the curative intent. Dr. Lisbeth Renshaw discusses the delivery and logistics of radiotherapy and recommends 4 weeks of radiotherapy. We will  see her back a few weeks after her upcoming surgery to see how she would like to proceed. We could proceed with  the simulation process and anticipate we starting radiotherapy about 4-6 weeks after surgery.    In a visit lasting 45 minutes, greater than 50% of the time was spent face to face reviewing her case, as well as in preparation of, discussing, and coordinating the patient's care.  The above documentation reflects my direct findings during this shared patient visit. Please see the separate note by Dr. Lisbeth Renshaw on this date for the remainder of the patient's plan of care.    Lalla Brothers  Dara Lords, [facility]    **Disclaimer: This note was dictated with voice recognition software. Similar sounding words can inadvertently be transcribed and this note may contain transcription errors which may not have been corrected upon publication of note.**

## 2021-03-16 ENCOUNTER — Encounter: Payer: Self-pay | Admitting: *Deleted

## 2021-03-20 ENCOUNTER — Other Ambulatory Visit: Payer: Self-pay | Admitting: *Deleted

## 2021-03-20 DIAGNOSIS — D0512 Intraductal carcinoma in situ of left breast: Secondary | ICD-10-CM

## 2021-03-25 ENCOUNTER — Encounter: Payer: Self-pay | Admitting: *Deleted

## 2021-03-27 ENCOUNTER — Other Ambulatory Visit: Payer: Self-pay

## 2021-03-27 ENCOUNTER — Encounter (HOSPITAL_BASED_OUTPATIENT_CLINIC_OR_DEPARTMENT_OTHER): Payer: Self-pay | Admitting: Surgery

## 2021-04-02 ENCOUNTER — Ambulatory Visit: Payer: 59

## 2021-04-02 ENCOUNTER — Ambulatory Visit: Payer: 59 | Admitting: Radiation Oncology

## 2021-04-07 ENCOUNTER — Ambulatory Visit: Payer: 59 | Admitting: Radiation Oncology

## 2021-04-08 ENCOUNTER — Encounter: Payer: Self-pay | Admitting: *Deleted

## 2021-04-09 ENCOUNTER — Ambulatory Visit (HOSPITAL_BASED_OUTPATIENT_CLINIC_OR_DEPARTMENT_OTHER): Admission: RE | Admit: 2021-04-09 | Payer: 59 | Source: Home / Self Care | Admitting: Surgery

## 2021-04-09 SURGERY — RE-EXCISION OF BREAST CANCER,SUPERIOR MARGINS
Anesthesia: General | Site: Breast | Laterality: Left

## 2021-04-23 ENCOUNTER — Encounter: Payer: Self-pay | Admitting: *Deleted

## 2021-04-27 ENCOUNTER — Encounter: Payer: Self-pay | Admitting: *Deleted

## 2021-04-29 ENCOUNTER — Encounter: Payer: Self-pay | Admitting: *Deleted

## 2021-05-07 ENCOUNTER — Encounter: Payer: Self-pay | Admitting: *Deleted

## 2021-05-13 ENCOUNTER — Ambulatory Visit: Payer: 59 | Admitting: Radiation Oncology

## 2021-05-13 ENCOUNTER — Ambulatory Visit: Payer: 59

## 2021-06-21 ENCOUNTER — Encounter: Payer: Self-pay | Admitting: Gastroenterology

## 2021-07-16 ENCOUNTER — Other Ambulatory Visit: Payer: Self-pay | Admitting: Family Medicine

## 2021-07-16 DIAGNOSIS — M5442 Lumbago with sciatica, left side: Secondary | ICD-10-CM

## 2021-08-03 ENCOUNTER — Ambulatory Visit
Admission: RE | Admit: 2021-08-03 | Discharge: 2021-08-03 | Disposition: A | Discharge status: Home / Self Care | Payer: 59 | Source: Ambulatory Visit | Attending: Family Medicine | Admitting: Family Medicine

## 2021-08-03 DIAGNOSIS — M5442 Lumbago with sciatica, left side: Secondary | ICD-10-CM

## 2021-08-18 ENCOUNTER — Encounter: Payer: Self-pay | Admitting: Gastroenterology

## 2021-09-25 IMAGING — MG MM BREAST BX W LOC DEV 1ST LESION IMAGE BX SPEC STEREO GUIDE*L*
8 of 12 series · 8 of 24 positions shown · non-contrast
Comparison: Previous exams.
COMPARISON: Previous exams.

Addendum:
CLINICAL DATA: 64-year-old with a screening detected 3 mm group of
indeterminate calcifications involving the OUTER LEFT breast at
POSTERIOR depth.

EXAM:
LEFT BREAST STEREOTACTIC CORE NEEDLE BIOPSY

[L (1 of 6)]
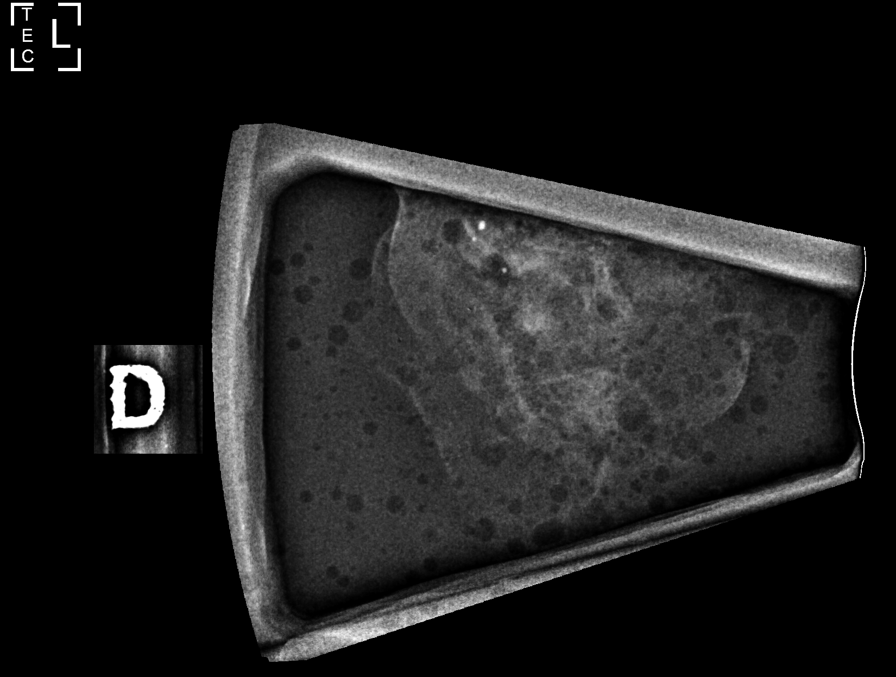

[L (2 of 6)]
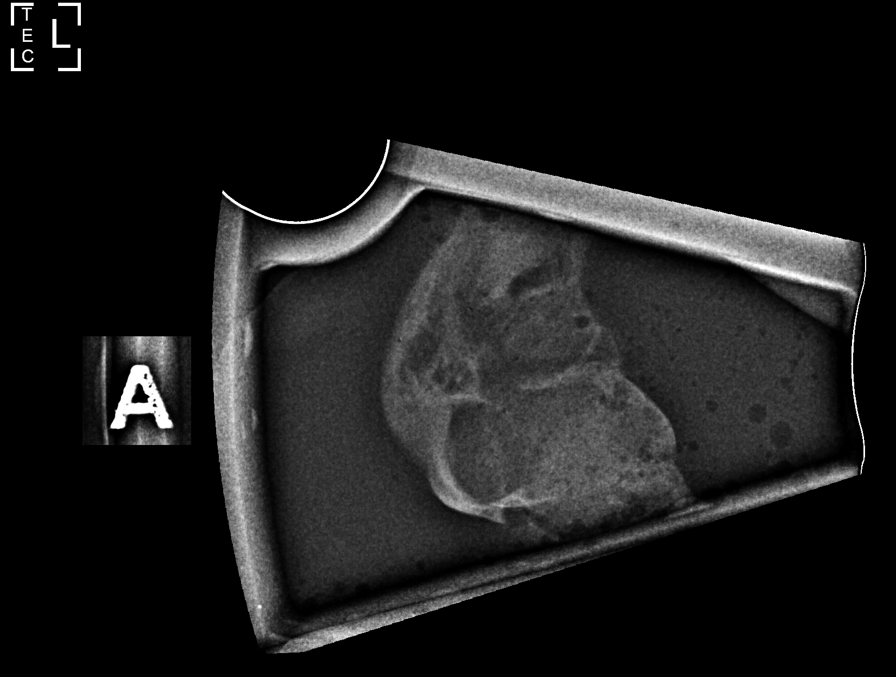

[L (3 of 6)]
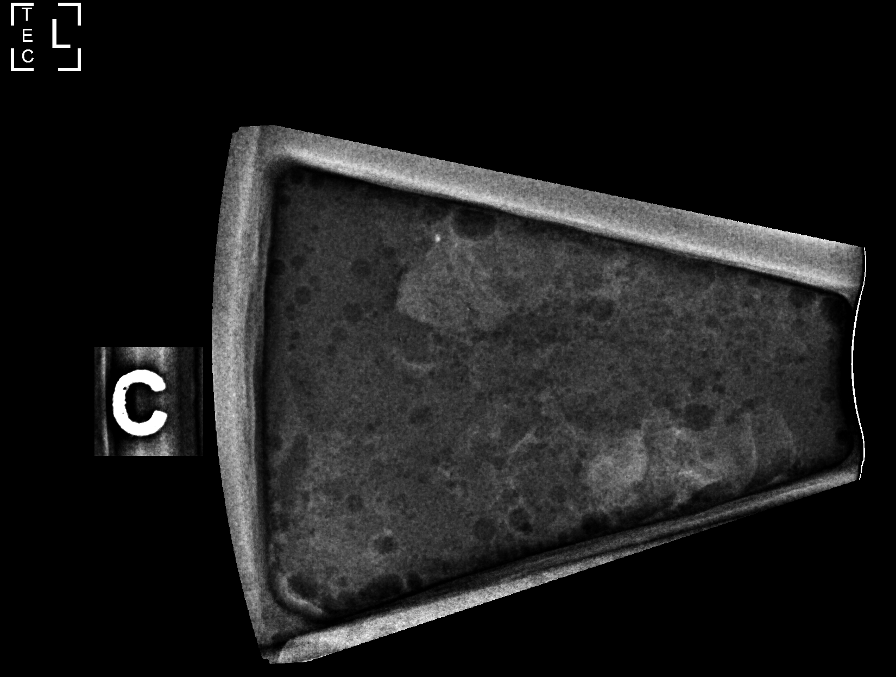

[L (4 of 6)]
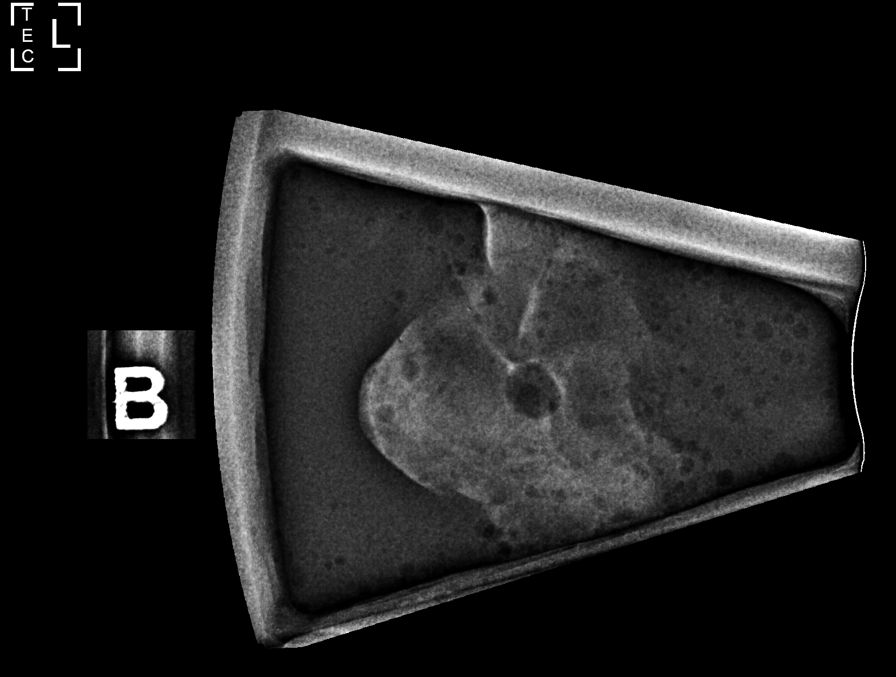

[L (5 of 6)]
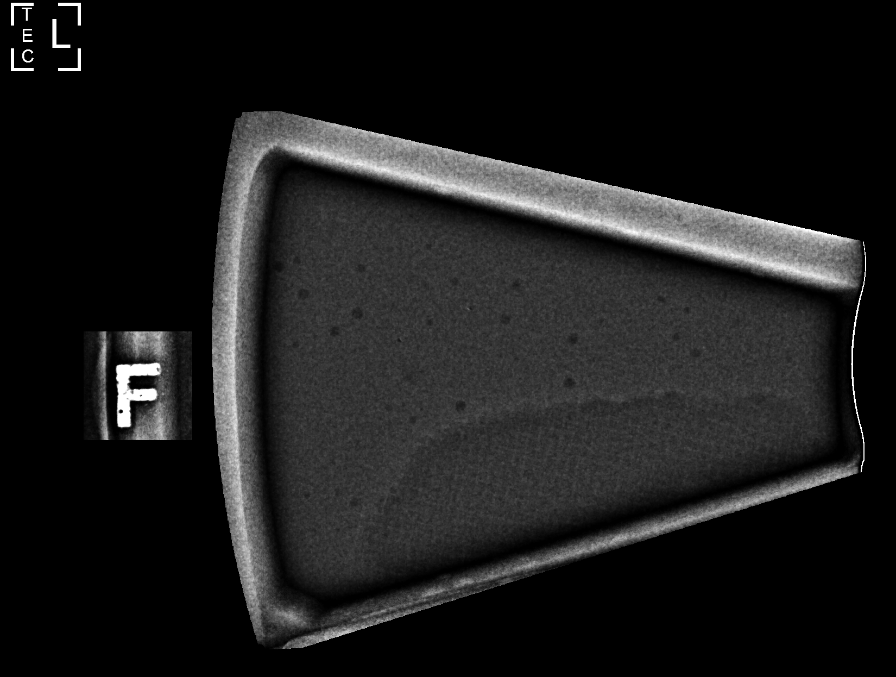

[L (6 of 6)]
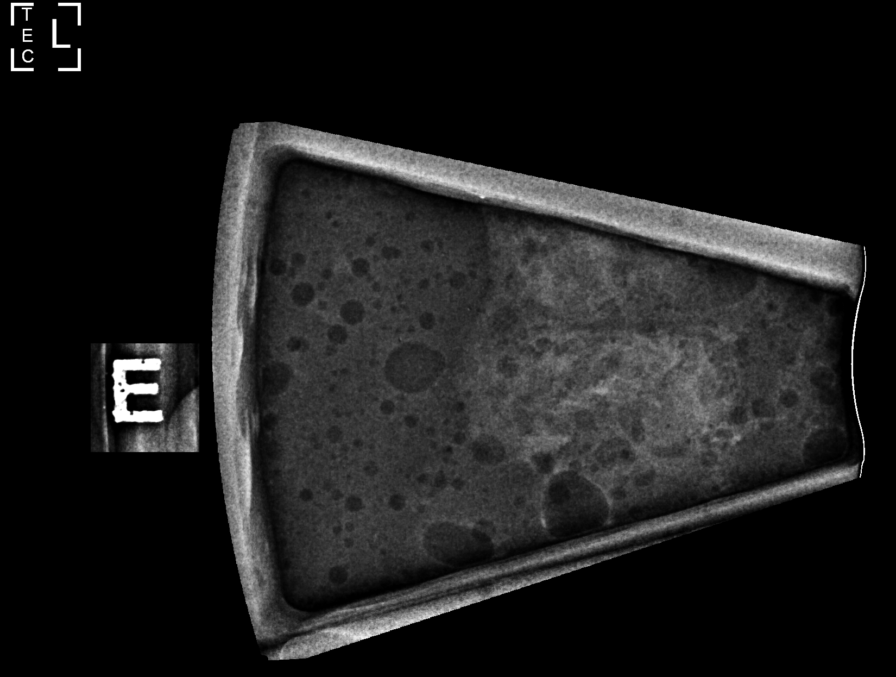

[L LM (1 of 2)]
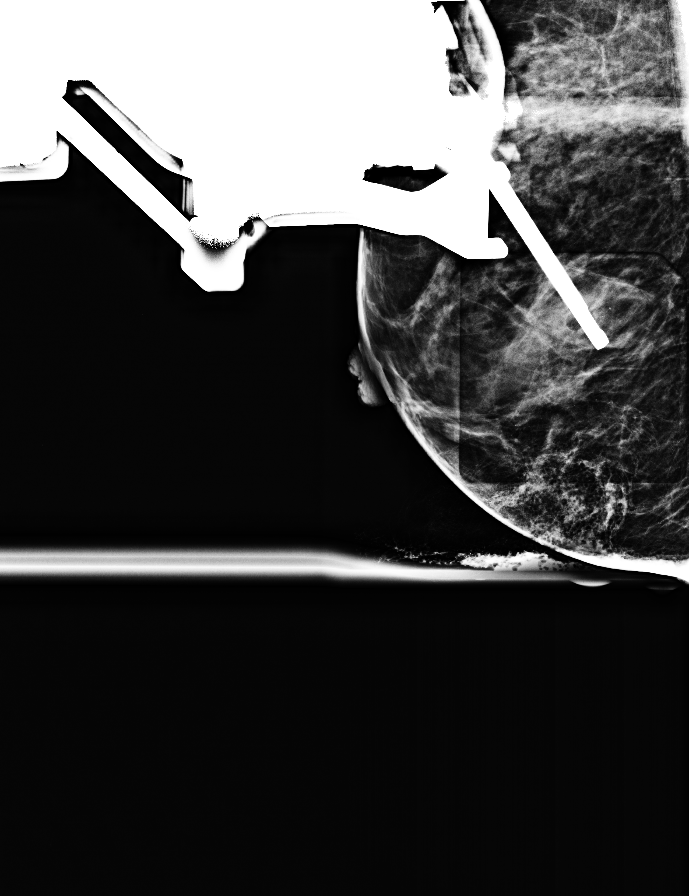

[L LM (2 of 2)]
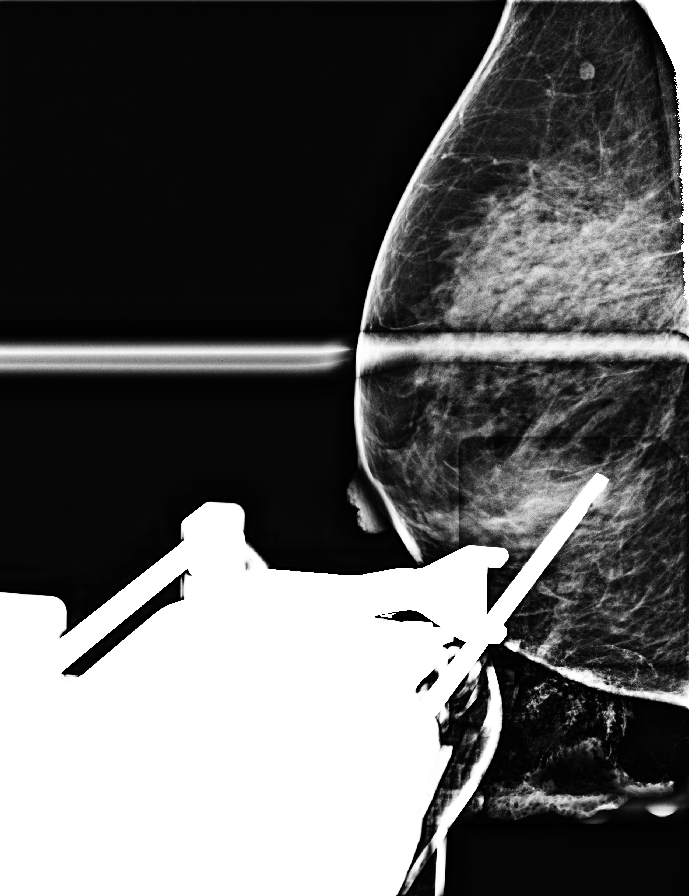

[8 of 24 positions shown; findings below may reference images not displayed]



Lesion quadrant: Outer breast, slight UPPER OUTER QUADRANT.

Using sterile technique with chlorhexidine as skin antisepsis, 1%
lidocaine and 1% lidocaine with epinephrine as local anesthetic,
under stereotactic tomosynthesis guidance, a 9 gauge Brevera vacuum
assisted device was used to perform core needle biopsy of
calcifications in the outer LEFT breast at posterior depth using a
lateral approach. Specimen radiograph was performed showing
calcifications in at least 2 of the core samples. Specimens with
calcifications are identified for pathology.

At the conclusion of the procedure, a coil shaped tissue marker clip
was deployed into the biopsy cavity. Follow-up 2-view mammogram was
performed and dictated separately.
IMPRESSION: Stereotactic-guided biopsy of calcifications involving the outer
LEFT breast at posterior depth. No apparent complications.

ADDENDUM:
Pathology revealed HIGH GRADE DUCTAL CARCINOMA IN SITU WITH
CALCIFICATION AND NECROSIS of the Left breast, outer at posterior
depth, (coil clip). This was found to be concordant by Dr. Johane
Mada.

Pathology results were discussed with the patient by telephone. The
patient reported doing well after the biopsy with tenderness at the
site. Post biopsy instructions and care were reviewed and questions
were answered. The patient was encouraged to call The [REDACTED] for any additional concerns. My direct phone
number was provided.

The patient was referred to [REDACTED]
[REDACTED] at [REDACTED] on

Consideration for a bilateral breast MRI for further evaluation of
extent of disease given the high grade histology, breast density,
and family history.

Pathology results reported by Thee Mari, RN on 02/05/2021.



Lesion quadrant: Outer breast, slight UPPER OUTER QUADRANT.

Using sterile technique with chlorhexidine as skin antisepsis, 1%
lidocaine and 1% lidocaine with epinephrine as local anesthetic,
under stereotactic tomosynthesis guidance, a 9 gauge Brevera vacuum
assisted device was used to perform core needle biopsy of
calcifications in the outer LEFT breast at posterior depth using a
lateral approach. Specimen radiograph was performed showing
calcifications in at least 2 of the core samples. Specimens with
calcifications are identified for pathology.

At the conclusion of the procedure, a coil shaped tissue marker clip
was deployed into the biopsy cavity. Follow-up 2-view mammogram was
performed and dictated separately.
IMPRESSION: Stereotactic-guided biopsy of calcifications involving the outer
LEFT breast at posterior depth. No apparent complications.

## 2021-10-09 DIAGNOSIS — D0512 Intraductal carcinoma in situ of left breast: Secondary | ICD-10-CM | POA: Diagnosis not present

## 2021-10-19 ENCOUNTER — Ambulatory Visit (AMBULATORY_SURGERY_CENTER): Payer: Self-pay

## 2021-10-19 ENCOUNTER — Other Ambulatory Visit: Payer: Self-pay

## 2021-10-19 VITALS — Ht 64.0 in | Wt 130.0 lb

## 2021-10-19 DIAGNOSIS — Z800 Family history of malignant neoplasm of digestive organs: Secondary | ICD-10-CM

## 2021-10-19 MED ORDER — NA SULFATE-K SULFATE-MG SULF 17.5-3.13-1.6 GM/177ML PO SOLN: 1.0000 | Freq: Once | ORAL | 0 refills | Status: AC

## 2021-10-19 NOTE — Progress Notes (Signed)
Denies allergies to eggs or soy products. Denies complication of anesthesia or sedation. Denies use of weight loss medication. Denies use of O2.   Emmi instructions given for colonoscopy.  

## 2021-10-20 ENCOUNTER — Telehealth: Payer: Self-pay | Admitting: Gastroenterology

## 2021-10-20 DIAGNOSIS — Z800 Family history of malignant neoplasm of digestive organs: Secondary | ICD-10-CM

## 2021-10-20 MED ORDER — NA SULFATE-K SULFATE-MG SULF 17.5-3.13-1.6 GM/177ML PO SOLN: 1.0000 | Freq: Once | ORAL | 0 refills | Status: AC

## 2021-10-20 NOTE — Telephone Encounter (Signed)
Inbound call from patient requesting prepfor upcoming procedure to be sent to [facility] on [address] in [place] . Phone number [phone]

## 2021-10-20 NOTE — Telephone Encounter (Signed)
Suprep to [facility] per pt request

## 2021-10-26 ENCOUNTER — Encounter: Payer: Self-pay | Admitting: Gastroenterology

## 2021-10-29 ENCOUNTER — Other Ambulatory Visit: Payer: Self-pay

## 2021-10-29 ENCOUNTER — Encounter: Payer: Self-pay | Admitting: Gastroenterology

## 2021-10-29 ENCOUNTER — Ambulatory Visit (AMBULATORY_SURGERY_CENTER): Payer: 59 | Admitting: Gastroenterology

## 2021-10-29 VITALS — BP 112/52 | HR 73 | Temp 97.8°F | Resp 13 | Ht 64.0 in | Wt 130.0 lb

## 2021-10-29 DIAGNOSIS — Z1211 Encounter for screening for malignant neoplasm of colon: Secondary | ICD-10-CM

## 2021-10-29 DIAGNOSIS — D122 Benign neoplasm of ascending colon: Secondary | ICD-10-CM | POA: Diagnosis not present

## 2021-10-29 DIAGNOSIS — Z800 Family history of malignant neoplasm of digestive organs: Secondary | ICD-10-CM

## 2021-10-29 MED ORDER — SODIUM CHLORIDE 0.9 % IV SOLN: 500.0000 mL | Freq: Once | INTRAVENOUS | Status: DC

## 2021-10-29 NOTE — Op Note (Signed)
[place] Patient Name: Amanda Wolf Procedure Date: 10/29/2021 9:26 AM MRN: [id number] Endoscopist: Mauri Pole , MD Age: 66 Referring MD:  Date of Birth: [birth year] Gender: Female Account #: [id number] Procedure:                Colonoscopy Indications:              Screening in patient at increased risk: Family                            history of 1st-degree relative with colorectal                            cancer Medicines:                Monitored Anesthesia Care Procedure:                Pre-Anesthesia Assessment:                           - Prior to the procedure, a History and Physical                            was performed, and patient medications and                            allergies were reviewed. The patient's tolerance of                            previous anesthesia was also reviewed. The risks                            and benefits of the procedure and the sedation                            options and risks were discussed with the patient.                            All questions were answered, and informed consent                            was obtained. Prior Anticoagulants: The patient has                            taken no previous anticoagulant or antiplatelet                            agents. ASA Grade Assessment: II - A patient with                            mild systemic disease. After reviewing the risks                            and benefits, the patient was deemed in  satisfactory condition to undergo the procedure.                           After obtaining informed consent, the colonoscope                            was passed under direct vision. Throughout the                            procedure, the patient's blood pressure, pulse, and                            oxygen saturations were monitored continuously. The                            0405 PCF-H190TL Slim SB Colonoscope was introduced                             through the anus and advanced to the the cecum,                            identified by appendiceal orifice and ileocecal                            valve. The colonoscopy was performed without                            difficulty. The patient tolerated the procedure                            well. The quality of the bowel preparation was                            excellent. The ileocecal valve, appendiceal                            orifice, and rectum were photographed. Scope In: 9:42:22 AM Scope Out: 9:53:47 AM Scope Withdrawal Time: 0 hours 8 minutes 22 seconds  Total Procedure Duration: 0 hours 11 minutes 25 seconds  Findings:                 The perianal and digital rectal examinations were                            normal.                           A less than 1 mm polyp was found in the ascending                            colon. The polyp was sessile. The polyp was removed                            with a cold biopsy forceps. Resection and retrieval  were complete.                           A few small-mouthed diverticula were found in the                            sigmoid colon.                           Non-bleeding internal hemorrhoids were found during                            retroflexion. The hemorrhoids were medium-sized. Complications:            No immediate complications. Estimated Blood Loss:     Estimated blood loss was minimal. Impression:               - One less than 1 mm polyp in the ascending colon,                            removed with a cold biopsy forceps. Resected and                            retrieved.                           - Diverticulosis in the sigmoid colon.                           - Non-bleeding internal hemorrhoids. Recommendation:           - Patient has a contact number available for                            emergencies. The signs and symptoms of potential                             delayed complications were discussed with the                            patient. Return to normal activities tomorrow.                            Written discharge instructions were provided to the                            patient.                           - Resume previous diet.                           - Continue present medications.                           - Await pathology results.                           -  Repeat colonoscopy in 5 years for surveillance                            based on pathology results. Mauri Pole, MD 10/29/2021 9:58:04 AM This report has been signed electronically.

## 2021-10-29 NOTE — Progress Notes (Signed)
[facility] History and Physical   Primary Care Physician:  Lois Huxley, PA   Reason for Procedure:  Family history of colon cancer  Plan:    Screening colonoscopy with possible interventions as needed     HPI: Amanda Wolf is a very pleasant 66 y.o. female here for screening colonoscopy. Denies any nausea, vomiting, abdominal pain, melena or bright red blood per rectum  The risks and benefits as well as alternatives of endoscopic procedure(s) have been discussed and reviewed. All questions answered. The patient agrees to proceed.    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 Authorizing Provider  cholecalciferol (VITAMIN D3) 25 MCG (1000 UNIT) tablet Take 1,000 Units by mouth daily.   Yes [provider]  CINNAMON PO Take by mouth daily.   Yes [provider]  co-enzyme Q-10 30 MG capsule Take 30 mg by mouth 3 (three) times daily.   Yes [provider]  Flaxseed, Linseed, (FLAXSEED OIL PO) Take by mouth daily.   Yes [provider]  MAGNESIUM PO Take by mouth daily.   Yes [provider]  OVER THE COUNTER MEDICATION CholestOff, one capsule daily.   Yes [provider]  OVER THE COUNTER MEDICATION Immune Booster, one or two capsules daily.   Yes [provider]  OVER THE COUNTER MEDICATION Chromemate, one capsule  daily.   Yes [provider]  OVER THE COUNTER MEDICATION Collagen powder, One scoop daily.   Yes [provider]  Red Yeast Rice 600 MG CAPS Take by mouth.   Yes [provider]  tamoxifen (NOLVADEX) 10 MG tablet Take 10 mg by mouth every other day.   Yes [provider]  calcium carbonate (OS-CAL) 600 MG TABS tablet Take by mouth.    [provider]  EVENING PRIMROSE OIL PO Take by mouth daily. Patient not taking: Reported on 10/29/2021    [provider]  Milk Thistle 200 MG CAPS See admin instructions. Patient not taking: Reported on 10/19/2021    [provider]  rosuvastatin (CRESTOR) 10 MG tablet Take 5 mg by mouth daily. Patient not taking: Reported on 10/29/2021    [provider]    Current Outpatient Medications  Medication Sig Dispense Refill   cholecalciferol (VITAMIN D3) 25 MCG (1000 UNIT) tablet Take 1,000 Units by mouth daily.     CINNAMON PO Take by mouth daily.     co-enzyme Q-10 30 MG capsule Take 30 mg by mouth 3 (three) times daily.     Flaxseed, Linseed, (FLAXSEED OIL PO) Take by mouth daily.     MAGNESIUM PO Take by mouth daily.     OVER THE COUNTER MEDICATION CholestOff, one capsule daily.     OVER THE COUNTER MEDICATION Immune Booster, one or two capsules daily.     OVER THE COUNTER MEDICATION  Chromemate, one capsule daily.     OVER THE COUNTER MEDICATION Collagen powder, One scoop daily.     Red Yeast Rice 600 MG CAPS Take by mouth.     tamoxifen (NOLVADEX) 10 MG tablet Take 10 mg by mouth every other day.     calcium carbonate (OS-CAL) 600 MG TABS tablet Take by mouth.     EVENING PRIMROSE OIL PO Take by mouth daily. (Patient not taking: Reported on 10/29/2021)     Milk Thistle 200 MG CAPS See admin instructions. (Patient not taking: Reported on 10/19/2021)     rosuvastatin (CRESTOR) 10 MG tablet Take 5 mg by mouth daily. (Patient not taking: Reported on 10/29/2021)     Current  Facility-Administered Medications  Medication Dose Route Frequency Provider Last Rate Last Admin   0.9 %  sodium chloride infusion  500 mL Intravenous Once Mauri Pole, MD        Allergies as of 10/29/2021 - Review Complete 10/29/2021  Allergen Reaction Noted   Indomethacin  10/09/2010   Sulfonamide derivatives  10/09/2010   Penicillins Rash 02/27/2016    Family History  Problem Relation Age of Onset   Colon cancer Mother 51   Breast cancer Sister 70       breast cancer    Breast cancer Maternal Aunt 31   Breast cancer Maternal Grandmother 90   Stroke Maternal Grandfather    Skin cancer Cousin        maternal first cousin   Esophageal cancer Neg Hx    Stomach cancer Neg Hx    Rectal cancer Neg Hx     Social History   Socioeconomic History   Marital status: Single    Spouse name: Not on file   Number of children: 0   Years of education: Not on file   Highest education level: Not on file  Occupational History   Occupation: retired   Tobacco Use   Smoking status: Never   Smokeless tobacco: Never  Substance and Sexual Activity   Alcohol use: Yes    Alcohol/week: 14.0 standard drinks    Types: 14 Glasses of wine per week    Comment: wine daily   Drug use: No   Sexual activity: Not Currently    Birth control/protection: Post-menopausal  Other Topics Concern   Not on file  Social History Narrative   Not on file   Social Determinants of Health   Financial Resource Strain: Not on file  Food Insecurity: Not on file  Transportation Needs: Not on file  Physical Activity: Not on file  Stress: Not on file  Social Connections: Not on file  Intimate Partner Violence: Not on file    Review of Systems:  All other review of systems negative except as mentioned in the HPI.  Physical Exam: Vital signs in last 24 hours: BP 106/63    Pulse 73    Temp 97.8 F (36.6 C)    Ht 5\' 4"  (1.626 m)    Wt 130 lb (59 kg)    SpO2 100%    BMI 22.31 kg/m  General:   Alert,  NAD Lungs:  Clear .   Heart:  Regular rate and rhythm Abdomen:  Soft, nontender and nondistended. Neuro/Psych:  Alert and cooperative. Normal mood and affect. A and O x 3  Reviewed labs, radiology imaging, old records and pertinent past GI work up  Patient is appropriate for planned procedure(s) and anesthesia in an ambulatory setting   K. Denzil Magnuson ,  MD [phone]

## 2021-10-29 NOTE — Progress Notes (Signed)
A and O x3. Report to RN. Tolerated MAC anesthesia well.

## 2021-10-29 NOTE — Patient Instructions (Signed)
Please read handouts provided. Continue present medications. Await pathology results.   YOU HAD AN ENDOSCOPIC PROCEDURE TODAY AT THE [facility]:   Refer to the procedure report that was given to you for any specific questions about what was found during the examination.  If the procedure report does not answer your questions, please call your gastroenterologist to clarify.  If you requested that your care partner not be given the details of your procedure findings, then the procedure report has been included in a sealed envelope for you to review at your convenience later.  YOU SHOULD EXPECT: Some feelings of bloating in the abdomen. Passage of more gas than usual.  Walking can help get rid of the air that was put into your GI tract during the procedure and reduce the bloating. If you had a lower endoscopy (such as a colonoscopy or flexible sigmoidoscopy) you may notice spotting of blood in your stool or on the toilet paper. If you underwent a bowel prep for your procedure, you may not have a normal bowel movement for a few days.  Please Note:  You might notice some irritation and congestion in your nose or some drainage.  This is from the oxygen used during your procedure.  There is no need for concern and it should clear up in a day or so.  SYMPTOMS TO REPORT IMMEDIATELY:  Following lower endoscopy (colonoscopy or flexible sigmoidoscopy):  Excessive amounts of blood in the stool  Significant tenderness or worsening of abdominal pains  Swelling of the abdomen that is new, acute  Fever of 100F or higher   For urgent or emergent issues, a gastroenterologist can be reached at any hour by calling [phone]. Do not use MyChart messaging for urgent concerns.    DIET:  We do recommend a small meal at first, but then you may proceed to your regular diet.  Drink plenty of fluids but you should avoid alcoholic beverages for 24 hours.  ACTIVITY:  You should plan to take it easy  for the rest of today and you should NOT DRIVE or use heavy machinery until tomorrow (because of the sedation medicines used during the test).    FOLLOW UP: Our staff will call the number listed on your records 48-72 hours following your procedure to check on you and address any questions or concerns that you may have regarding the information given to you following your procedure. If we do not reach you, we will leave a message.  We will attempt to reach you two times.  During this call, we will ask if you have developed any symptoms of COVID 19. If you develop any symptoms (ie: fever, flu-like symptoms, shortness of breath, cough etc.) before then, please call [phone].  If you test positive for Covid 19 in the 2 weeks post procedure, please call and report this information to us.    If any biopsies were taken you will be contacted by phone or by letter within the next 1-3 weeks.  Please call us at [phone] if you have not heard about the biopsies in 3 weeks.    SIGNATURES/CONFIDENTIALITY: You and/or your care partner have signed paperwork which will be entered into your electronic medical record.  These signatures attest to the fact that that the information above on your After Visit Summary has been reviewed and is understood.  Full responsibility of the confidentiality of this discharge information lies with you and/or your care-partner.  

## 2021-10-29 NOTE — Progress Notes (Signed)
Called to room to assist during endoscopic procedure.  Patient ID and intended procedure confirmed with present staff. Received instructions for my participation in the procedure from the performing physician.  

## 2021-11-02 ENCOUNTER — Telehealth: Payer: Self-pay | Admitting: *Deleted

## 2021-11-02 NOTE — Telephone Encounter (Signed)
No answer on first follow up call. Left message  °

## 2021-11-02 NOTE — Telephone Encounter (Signed)
No answer on 2nd attempt follow up call

## 2021-11-17 ENCOUNTER — Encounter: Payer: Self-pay | Admitting: Gastroenterology

## 2021-12-28 DIAGNOSIS — Z6823 Body mass index (BMI) 23.0-23.9, adult: Secondary | ICD-10-CM | POA: Diagnosis not present

## 2021-12-28 DIAGNOSIS — Z124 Encounter for screening for malignant neoplasm of cervix: Secondary | ICD-10-CM | POA: Diagnosis not present

## 2021-12-28 DIAGNOSIS — Z13820 Encounter for screening for osteoporosis: Secondary | ICD-10-CM | POA: Diagnosis not present

## 2021-12-28 DIAGNOSIS — Z01419 Encounter for gynecological examination (general) (routine) without abnormal findings: Secondary | ICD-10-CM | POA: Diagnosis not present

## 2022-01-05 DIAGNOSIS — D0591 Unspecified type of carcinoma in situ of right breast: Secondary | ICD-10-CM | POA: Diagnosis not present

## 2022-01-05 DIAGNOSIS — D0512 Intraductal carcinoma in situ of left breast: Secondary | ICD-10-CM | POA: Diagnosis not present

## 2022-01-05 DIAGNOSIS — Z79810 Long term (current) use of selective estrogen receptor modulators (SERMs): Secondary | ICD-10-CM | POA: Diagnosis not present

## 2022-01-05 DIAGNOSIS — R922 Inconclusive mammogram: Secondary | ICD-10-CM | POA: Diagnosis not present

## 2022-01-05 DIAGNOSIS — Z5181 Encounter for therapeutic drug level monitoring: Secondary | ICD-10-CM | POA: Diagnosis not present

## 2022-01-05 DIAGNOSIS — Z9189 Other specified personal risk factors, not elsewhere classified: Secondary | ICD-10-CM | POA: Diagnosis not present

## 2022-01-12 ENCOUNTER — Encounter (HOSPITAL_COMMUNITY): Payer: Self-pay

## 2022-02-18 DIAGNOSIS — E559 Vitamin D deficiency, unspecified: Secondary | ICD-10-CM | POA: Diagnosis not present

## 2022-02-18 DIAGNOSIS — Z0000 Encounter for general adult medical examination without abnormal findings: Secondary | ICD-10-CM | POA: Diagnosis not present

## 2022-04-29 DIAGNOSIS — I890 Lymphedema, not elsewhere classified: Secondary | ICD-10-CM | POA: Diagnosis not present

## 2022-04-29 DIAGNOSIS — D0512 Intraductal carcinoma in situ of left breast: Secondary | ICD-10-CM | POA: Diagnosis not present

## 2022-06-03 ENCOUNTER — Ambulatory Visit: Payer: 59 | Attending: Adult Health | Admitting: Physical Therapy

## 2022-06-03 ENCOUNTER — Other Ambulatory Visit: Payer: Self-pay

## 2022-06-03 ENCOUNTER — Encounter: Payer: Self-pay | Admitting: Physical Therapy

## 2022-06-03 DIAGNOSIS — I890 Lymphedema, not elsewhere classified: Secondary | ICD-10-CM

## 2022-06-03 DIAGNOSIS — M25612 Stiffness of left shoulder, not elsewhere classified: Secondary | ICD-10-CM

## 2022-06-03 DIAGNOSIS — M25512 Pain in left shoulder: Secondary | ICD-10-CM

## 2022-06-03 NOTE — Therapy (Signed)
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   Family history of breast cancer    Family history of colon cancer    Hypercholesteremia    Osteopenia    Osteopenia    Past Surgical History:  Procedure Laterality Date   BREAST BIOPSY Left 1980   BREAST LUMPECTOMY WITH RADIOACTIVE SEED LOCALIZATION Left 03/09/2021   Procedure: LEFT BREAST LUMPECTOMY WITH RADIOACTIVE SEED LOCALIZATION;  Surgeon: Coralie Keens, MD;  Location: [place];  Service: General;  Laterality: Left;   uterine cyst  1990   Patient Active Problem List   Diagnosis Date Noted   Genetic testing 02/23/2021   Family history of breast cancer    Family history of colon cancer    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   Lower back injury, subsequent encounter 12/29/2016    PCP: Marilynne Drivers, PA  REFERRING PROVIDER: Salomon Fick, NP  REFERRING DIAG: 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 It is getting in the way of my workout. I went to my massage therapist and she could tell there was some fluid. She has been doing MLD. I went to the doctor and got a prescription for compression bras.   PERTINENT HISTORY:  Hx of L lumpectomy 03/09/21 and radiation completed 07/2021 for breast cancer, reexcision on 04/17/21 and 05/01/21 PAIN:  Are you  having pain? Yes NPRS scale: 2/10 Pain location: L shoulder Pain orientation: Left  PAIN TYPE: discomfort Pain description: intermittent  Aggravating factors: moving the shoulder Relieving factors: massage  PRECAUTIONS: Other: lymphedema  WEIGHT BEARING RESTRICTIONS No  FALLS:  Has patient fallen in last 6 months? Yes. Number of falls 1 pt reports she dripped water on the floor and fell in it  LIVING ENVIRONMENT: Lives with: lives alone Lives in: House/apartment Has following equipment at home: None  OCCUPATION: retired  LEISURE: pt exercises 6 days/wk, 3 days/wk weight training and the rest of the time she walks 2 miles  HAND DOMINANCE : right   PRIOR LEVEL OF FUNCTION: Independent  PATIENT GOALS to not wake up with a swollen breast, to wake up without pain   OBJECTIVE  COGNITION:  Overall cognitive status: Within functional limits for tasks assessed   PALPATION: No fibrosis present in L breast currently  OBSERVATIONS / OTHER ASSESSMENTS: L breast larger than right  POSTURE: forward head  UPPER EXTREMITY AROM/PROM:  A/PROM RIGHT   eval   Shoulder extension 78  Shoulder flexion 162  Shoulder abduction 180  Shoulder internal rotation 69  Shoulder external rotation 88    (Blank rows = not tested)  A/PROM LEFT   eval  Shoulder extension 65  Shoulder flexion 163  Shoulder abduction 167  Shoulder internal rotation 77  Shoulder external rotation 88    (Blank rows = not tested)   UPPER EXTREMITY  STRENGTH: 5/5   LYMPHEDEMA ASSESSMENTS:   SURGERY TYPE/DATE: 03/09/21 - L lumpectomy, reexcision 04/17/22, reexcision 05/01/22  NUMBER OF LYMPH NODES REMOVED: none due to DCIS  CHEMOTHERAPY: none  RADIATION: completed 06/11/21-07/08/21  HORMONE TREATMENT: on Tamoxifen 07/22/21  INFECTIONS: none  LYMPHEDEMA ASSESSMENTS:   LANDMARK RIGHT  eval  10 cm proximal to olecranon process 25  Olecranon process 23.4  10 cm proximal to ulnar styloid process 19  Just proximal to ulnar styloid process 14.5  Across hand at thumb web space 18.7  At base of 2nd digit 5.9  (Blank rows = not tested)  LANDMARK LEFT  eval  10 cm proximal to olecranon process 24.6  Olecranon process 22.7  10 cm proximal to ulnar styloid process 18.6  Just proximal to ulnar styloid process 13.7  Across hand at thumb web space 18.8  At base of 2nd digit 6  (Blank rows = not tested)  QUICK DASH SURVEY:   Katina Dung - 06/03/22 0001     Open a tight or new jar No difficulty    Do heavy household chores (wash walls, wash floors) No difficulty    Carry a shopping bag or briefcase No difficulty    Wash your back No difficulty    Use a knife to cut food No difficulty    Recreational activities in which you take some force or impact through your arm, shoulder, or hand (golf, hammering, tennis) Mild difficulty    During the past week, to what extent has your arm, shoulder or hand problem interfered with your normal social activities with family, friends, neighbors, or groups? Not at all    During the past week, to what extent has your arm, shoulder or hand problem limited your work or other regular daily activities Slightly    Arm, shoulder, or hand pain. Mild    Tingling (pins and needles) in your arm, shoulder, or hand Mild    Difficulty Sleeping No difficulty    DASH Score 9.09 %  BREAST COMPLAINTS SCALE: 3   TODAY'S TREATMENT  06/03/22- measured pt for compression sleeve and glove for [place] in 2 weeks and issued info for her to obtain from compression guru - she was in between a Juzo Dynamic size 1 and size 1 max so pt ordered both and will see which one fits best and a juzo basic glove size small. Pt did not fit well in to the medi harmony so decided on Juzo. Educated pt in correct donning technique and educated her to try on size 1 once it arrives.   PATIENT EDUCATION:  Education details: ordering compression garments, wearing compression bra Person educated: Patient Education method: Theatre stage manager Education comprehension: verbalized understanding   HOME EXERCISE PROGRAM: Wear compression bra and try compression sleeve when it arrives  ASSESSMENT:  CLINICAL IMPRESSION: Patient is a 66 y.o. female who was seen today for physical therapy evaluation and treatment for L breast and upper arm lymphedema, decreased L shoulder ROM and L shoulder pain. Pt reports in the past 6 weeks she has developed L breast lymphedema which also occasionally affects her upper arm. She has been having discomfort at end range of L shoulder ROM and well as stiffness with L shoulder ROM. Pt would benefit from skilled PT services to decrease L breast lymphedema and upper arm edema (which was not measurable today but pt reports this comes and goes), decrease L shoulder ROM and pain.     OBJECTIVE IMPAIRMENTS decreased knowledge of condition, decreased knowledge of use of DME, decreased ROM, increased edema, increased fascial restrictions, impaired UE functional use, postural dysfunction, and pain.   ACTIVITY LIMITATIONS reach over head  PARTICIPATION LIMITATIONS:  gym activities  PERSONAL FACTORS  none  are also affecting patient's functional outcome.   REHAB POTENTIAL: Good  CLINICAL DECISION MAKING: Stable/uncomplicated  EVALUATION COMPLEXITY: Low  GOALS: Goals reviewed with patient? Yes  SHORT TERM GOALS=LONG TERM GOALS Target date: 07/01/2022    Pt will demonstrate 175  degrees of L shoulder abduction to allow pt to reach out to the side. Baseline:167 Goal status: INITIAL  2.  Pt will report she is no longer having pain in her L shoulder with end range of motion. Baseline:  Goal status: INITIAL  3.  Pt will be independent in MLD for long term management of lymphedema. Baseline:  Goal status: INITIAL  4.  Pt will obtain appropriate sleeve and glove to use for compression during overseas flight.  Baseline:  Goal status: INITIAL  PLAN: PT FREQUENCY: 2x/week  PT DURATION: 4 weeks  PLANNED INTERVENTIONS: Therapeutic exercises, Patient/Family education, Self Care, Joint mobilization, Manual lymph drainage, Compression bandaging, scar mobilization, Vasopneumatic device, Manual therapy, and Re-evaluation  PLAN FOR NEXT SESSION: teach MLD for L breast, assess sleeve and glove for fit once she obtains them, PROM to L shoulder to decrease pain and tightness   [employer], PT 06/03/2022, 2:57 PM

## 2022-06-09 ENCOUNTER — Ambulatory Visit: Payer: 59 | Attending: Adult Health

## 2022-06-09 DIAGNOSIS — I890 Lymphedema, not elsewhere classified: Secondary | ICD-10-CM | POA: Insufficient documentation

## 2022-06-09 DIAGNOSIS — M25612 Stiffness of left shoulder, not elsewhere classified: Secondary | ICD-10-CM | POA: Diagnosis not present

## 2022-06-09 DIAGNOSIS — M25512 Pain in left shoulder: Secondary | ICD-10-CM | POA: Insufficient documentation

## 2022-06-09 NOTE — Therapy (Signed)
OUTPATIENT PHYSICAL THERAPY ONCOLOGY EVALUATION  Patient Name: Amanda Wolf MRN: [id number] DOB:26-Feb-1956, 66 y.o., female Today's Date: 06/09/2022   PT End of Session - 06/09/22 1601     Visit Number 2    Number of Visits 9    Date for PT Re-Evaluation 07/01/22    PT Start Time 1603    PT Stop Time 8469    PT Time Calculation (min) 46 min    Activity Tolerance Patient tolerated treatment well    Behavior During Therapy [facility] for tasks assessed/performed             Past Medical History:  Diagnosis Date   Cancer ([place]) 02/2021   left breast DCIS   Family history of breast cancer    Family history of colon cancer    Hypercholesteremia    Osteopenia    Osteopenia    Past Surgical History:  Procedure Laterality Date   BREAST BIOPSY Left 1980   BREAST LUMPECTOMY WITH RADIOACTIVE SEED LOCALIZATION Left 03/09/2021   Procedure: LEFT BREAST LUMPECTOMY WITH RADIOACTIVE SEED LOCALIZATION;  Surgeon: Coralie Keens, MD;  Location: [place];  Service: General;  Laterality: Left;   uterine cyst  1990   Patient Active Problem List   Diagnosis Date Noted   Genetic testing 02/23/2021   Family history of breast cancer    Family history of colon cancer    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   Lower back injury, subsequent encounter 12/29/2016    PCP: Marilynne Drivers, PA  REFERRING PROVIDER: Salomon Fick, NP  REFERRING DIAG: 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)   Lymphedema of breast I89.0   Decreased range of motion of left shoulder (M25.612)  THERAPY DIAG:  Lymphedema, not elsewhere classified  Stiffness of left shoulder, not elsewhere classified  Acute pain of left shoulder  ONSET DATE: 04/17/22  Rationale for Evaluation and Treatment Rehabilitation  SUBJECTIVE                                                                                                                                                                                            SUBJECTIVE STATEMENT: I got one sleeve and my glove but not the Max sleeve even though it was supposed to be in the same order. I was between sizes so we ordered both. I have been wearing the compression bra and it has helped. I have gotten masages twice a week and that has helped and she has shown me some different techniques to use on myself   PERTINENT HISTORY:  Hx of L lumpectomy 03/09/21 and radiation  completed 07/2021 for breast cancer, reexcision on 04/17/21 and 05/01/21 PAIN:  Are you having pain? Yes NPRS scale: 1/10 Pain location: L shoulder Pain orientation: Left  PAIN TYPE: discomfort Pain description: intermittent  Aggravating factors: moving the shoulder Relieving factors: massage  PRECAUTIONS: Other: lymphedema  WEIGHT BEARING RESTRICTIONS No  FALLS:  Has patient fallen in last 6 months? Yes. Number of falls 1 pt reports she dripped water on the floor and fell in it  LIVING ENVIRONMENT: Lives with: lives alone Lives in: House/apartment Has following equipment at home: None  OCCUPATION: retired  LEISURE: pt exercises 6 days/wk, 3 days/wk weight training and the rest of the time she walks 2 miles  HAND DOMINANCE : right   PRIOR LEVEL OF FUNCTION: Independent  PATIENT GOALS to not wake up with a swollen breast, to wake up without pain   OBJECTIVE  COGNITION:  Overall cognitive status: Within functional limits for tasks assessed   PALPATION: No fibrosis present in L breast currently  OBSERVATIONS / OTHER ASSESSMENTS: L breast larger than right  POSTURE: forward head  UPPER EXTREMITY AROM/PROM:  A/PROM RIGHT   eval   Shoulder extension 78  Shoulder flexion 162  Shoulder abduction 180  Shoulder internal rotation 69  Shoulder external rotation 88    (Blank rows = not tested)  A/PROM LEFT   eval  Shoulder extension 65  Shoulder flexion 163  Shoulder abduction 167  Shoulder internal rotation 77  Shoulder  external rotation 88    (Blank rows = not tested)   UPPER EXTREMITY STRENGTH: 5/5   LYMPHEDEMA ASSESSMENTS:   SURGERY TYPE/DATE: 03/09/21 - L lumpectomy, reexcision 04/17/22, reexcision 05/01/22  NUMBER OF LYMPH NODES REMOVED: none due to DCIS  CHEMOTHERAPY: none  RADIATION: completed 06/11/21-07/08/21  HORMONE TREATMENT: on Tamoxifen 07/22/21  INFECTIONS: none  LYMPHEDEMA ASSESSMENTS:   LANDMARK RIGHT  eval  10 cm proximal to olecranon process 25  Olecranon process 23.4  10 cm proximal to ulnar styloid process 19  Just proximal to ulnar styloid process 14.5  Across hand at thumb web space 18.7  At base of 2nd digit 5.9  (Blank rows = not tested)  LANDMARK LEFT  eval  10 cm proximal to olecranon process 24.6  Olecranon process 22.7  10 cm proximal to ulnar styloid process 18.6  Just proximal to ulnar styloid process 13.7  Across hand at thumb web space 18.8  At base of 2nd digit 6  (Blank rows = not tested)  QUICK DASH SURVEY:     BREAST COMPLAINTS SCALE: 3   TODAY'S TREATMENT  06/09/2022 Showed pt the proper way to don/doff compression sleeve and glove Size 1 reg. [name] Showed how to use rubber gloves for improved ease Instructed Pt in MLD to the left breast;Short neck, 5 diaphragmatic breaths, activated bilateral axillary LN's, left inguinal LN's, anterior interaxillary pathway, left axillo-inguinal pathway and left breast redirecting to appropriate pathways and ending with LN's. Explained lightness of techniques and showed poster how superficial lymphatics are just below the skin. Gave handout with pictures    06/03/22- measured pt for compression sleeve and glove for trip [place] 2 [place] and issued info for her to obtain from compression guru - she was in between a Juzo Dynamic size 1 and size 1 max so pt ordered both and will see which one fits best and a juzo basic glove size small. Pt did not fit well in to the medi harmony so decided on Juzo. Educated pt in  correct  donning technique and educated her to try on size 1 once it arrives.   PATIENT EDUCATION:  Education details: ordering compression garments, wearing compression bra,  Self MLD to left breast Person educated: Patient Education method: Explanation and Handouts Education comprehension: verbalized understanding   HOME EXERCISE PROGRAM: Wear compression bra and try compression sleeve when it arrives  ASSESSMENT:  CLINICAL IMPRESSION: Showed pt proper way to don garments and she will try on the max sleeve when she receives it. Initiated left breast MLD and instructed pt in all techniques.  She had initial difficulty with pressure but improved with practice and had a good understanding of the sequence  OBJECTIVE IMPAIRMENTS decreased knowledge of condition, decreased knowledge of use of DME, decreased ROM, increased edema, increased fascial restrictions, impaired UE functional use, postural dysfunction, and pain.   ACTIVITY LIMITATIONS reach over head  PARTICIPATION LIMITATIONS:  gym activities  PERSONAL FACTORS  none  are also affecting patient's functional outcome.   REHAB POTENTIAL: Good  CLINICAL DECISION MAKING: Stable/uncomplicated  EVALUATION COMPLEXITY: Low  GOALS: Goals reviewed with patient? Yes  SHORT TERM GOALS=LONG TERM GOALS Target date: 07/07/2022    Pt will demonstrate 175 degrees of L shoulder abduction to allow pt to reach out to the side. Baseline:167 Goal status: INITIAL  2.  Pt will report she is no longer having pain in her L shoulder with end range of motion. Baseline:  Goal status: INITIAL  3.  Pt will be independent in MLD for long term management of lymphedema. Baseline:  Goal status: INITIAL  4.  Pt will obtain appropriate sleeve and glove to use for compression during overseas flight.  Baseline:  Goal status: INITIAL  PLAN: PT FREQUENCY: 2x/week  PT DURATION: 4 weeks  PLANNED INTERVENTIONS: Therapeutic exercises, Patient/Family  education, Self Care, Joint mobilization, Manual lymph drainage, Compression bandaging, scar mobilization, Vasopneumatic device, Manual therapy, and Re-evaluation  PLAN FOR NEXT SESSION: teach MLD for L breast and add left upper arm  prn, assess sleeve and glove for fit once she obtains them, PROM to L shoulder to decrease pain and tightness   Claris Pong, PT 06/09/2022, 4:54 PM

## 2022-06-11 ENCOUNTER — Ambulatory Visit: Payer: 59

## 2022-06-11 DIAGNOSIS — M25512 Pain in left shoulder: Secondary | ICD-10-CM | POA: Diagnosis not present

## 2022-06-11 DIAGNOSIS — M25612 Stiffness of left shoulder, not elsewhere classified: Secondary | ICD-10-CM

## 2022-06-11 DIAGNOSIS — I890 Lymphedema, not elsewhere classified: Secondary | ICD-10-CM | POA: Diagnosis not present

## 2022-06-11 NOTE — Therapy (Addendum)
OUTPATIENT PHYSICAL THERAPY ONCOLOGY TREATMENT  Patient Name: Amanda Wolf MRN: [id number] DOB:28-May-1956, 66 y.o., female Today's Date: 06/11/2022   PT End of Session - 06/11/22 0800     Visit Number 3    Number of Visits 9    Date for PT Re-Evaluation 07/01/22    PT Start Time 0801    PT Stop Time 0904    PT Time Calculation (min) 63 min    Activity Tolerance Patient tolerated treatment well    Behavior During Therapy [facility] for tasks assessed/performed             Past Medical History:  Diagnosis Date   Cancer ([place]) 02/2021   left breast DCIS   Family history of breast cancer    Family history of colon cancer    Hypercholesteremia    Osteopenia    Osteopenia    Past Surgical History:  Procedure Laterality Date   BREAST BIOPSY Left 1980   BREAST LUMPECTOMY WITH RADIOACTIVE SEED LOCALIZATION Left 03/09/2021   Procedure: LEFT BREAST LUMPECTOMY WITH RADIOACTIVE SEED LOCALIZATION;  Surgeon: Coralie Keens, MD;  Location: [place];  Service: General;  Laterality: Left;   uterine cyst  1990   Patient Active Problem List   Diagnosis Date Noted   Genetic testing 02/23/2021   Family history of breast cancer    Family history of colon cancer    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   Lower back injury, subsequent encounter 12/29/2016    PCP: Marilynne Drivers, PA  REFERRING PROVIDER: Salomon Fick, NP  REFERRING DIAG: 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)   Lymphedema of breast I89.0   Decreased range of motion of left shoulder (M25.612)  THERAPY DIAG:  Lymphedema, not elsewhere classified  Stiffness of left shoulder, not elsewhere classified  Acute pain of left shoulder  ONSET DATE: 04/17/22  Rationale for Evaluation and Treatment Rehabilitation  SUBJECTIVE                                                                                                                                                                                            SUBJECTIVE STATEMENT: My other sleeve hasn't arrived yet but they said it was on backorder and should ship today. I think my Lt breast is seeming to be doing better.    PERTINENT HISTORY:  Hx of L lumpectomy 03/09/21 and radiation completed 07/2021 for breast cancer, reexcision on 04/17/21 and 05/01/21; no lymph nodes removed PAIN:  Are you having pain? No NPRS scale: 0/10 Pain location: L shoulder Pain orientation: Left  PAIN TYPE:  Pain  description:  Aggravating factors:  Relieving factors:   PRECAUTIONS: Other: lymphedema  WEIGHT BEARING RESTRICTIONS No  FALLS:  Has patient fallen in last 6 months? Yes. Number of falls 1 pt reports she dripped water on the floor and fell in it  LIVING ENVIRONMENT: Lives with: lives alone Lives in: House/apartment Has following equipment at home: None  OCCUPATION: retired  LEISURE: pt exercises 6 days/wk, 3 days/wk weight training and the rest of the time she walks 2 miles  HAND DOMINANCE : right   PRIOR LEVEL OF FUNCTION: Independent  PATIENT GOALS to not wake up with a swollen breast, to wake up without pain   OBJECTIVE  COGNITION:  Overall cognitive status: Within functional limits for tasks assessed   PALPATION: No fibrosis present in L breast currently  OBSERVATIONS / OTHER ASSESSMENTS: L breast larger than right  POSTURE: forward head  UPPER EXTREMITY AROM/PROM:  A/PROM RIGHT   eval   Shoulder extension 78  Shoulder flexion 162  Shoulder abduction 180  Shoulder internal rotation 69  Shoulder external rotation 88    (Blank rows = not tested)  A/PROM LEFT   eval  Shoulder extension 65  Shoulder flexion 163  Shoulder abduction 167  Shoulder internal rotation 77  Shoulder external rotation 88    (Blank rows = not tested)   UPPER EXTREMITY STRENGTH: 5/5   LYMPHEDEMA ASSESSMENTS:   SURGERY TYPE/DATE: 03/09/21 - L lumpectomy, reexcision 04/17/22, reexcision 05/01/22  NUMBER OF  LYMPH NODES REMOVED: none due to DCIS  CHEMOTHERAPY: none  RADIATION: completed 06/11/21-07/08/21  HORMONE TREATMENT: on Tamoxifen 07/22/21  INFECTIONS: none  LYMPHEDEMA ASSESSMENTS:   LANDMARK RIGHT  eval  10 cm proximal to olecranon process 25  Olecranon process 23.4  10 cm proximal to ulnar styloid process 19  Just proximal to ulnar styloid process 14.5  Across hand at thumb web space 18.7  At base of 2nd digit 5.9  (Blank rows = not tested)  LANDMARK LEFT  eval  10 cm proximal to olecranon process 24.6  Olecranon process 22.7  10 cm proximal to ulnar styloid process 18.6  Just proximal to ulnar styloid process 13.7  Across hand at thumb web space 18.8  At base of 2nd digit 6  (Blank rows = not tested)  QUICK DASH SURVEY:     BREAST COMPLAINTS SCALE: 3   TODAY'S TREATMENT   06/11/22 Manual Therapy MLD to Left breast: Short neck, 5 diaphragmatic breaths (pt required multiple VCs for correct technique and will benefit from further review), Rt axillary LN's, left inguinal LN's, anterior interaxillary pathway, left axillo-inguinal pathway and left breast redirecting to appropriate pathways, then into Rt S/L for further work at lateral breast and to instruct pt greater ease at reaching her Lt axillo-inguinal anastomosis, then finished retracing steps in supine and ending with LN's. Began instructing pt while performing having her return demo. Pt was able to return good demo with good skin stretch and required VCs for lighter pressure which she did well with correcting.   06/09/2022 Showed pt the proper way to don/doff compression sleeve and glove Size 1 reg. [name] Showed how to use rubber gloves for improved ease Instructed Pt in MLD to the left breast;Short neck, 5 diaphragmatic breaths, activated bilateral axillary LN's, left inguinal LN's, anterior interaxillary pathway, left axillo-inguinal pathway and left breast redirecting to appropriate pathways and ending with LN's.  Explained lightness of techniques and showed poster how superficial lymphatics are just below the skin. Gave handout with pictures  06/03/22- measured pt for compression sleeve and glove for trip overseas in 2 weeks and issued info for her to obtain from compression guru - she was in between a Juzo Dynamic size 1 and size 1 max so pt ordered both and will see which one fits best and a juzo basic glove size small. Pt did not fit well in to the medi harmony so decided on Juzo. Educated pt in correct donning technique and educated her to try on size 1 once it arrives.   PATIENT EDUCATION:  Education details: ordering compression garments, wearing compression bra,  Self MLD to left breast Person educated: Patient Education method: Explanation and Handouts Education comprehension: verbalized understanding   HOME EXERCISE PROGRAM: Wear compression bra and try compression sleeve when it arrives  ASSESSMENT:  CLINICAL IMPRESSION: Continued with MLD of LT breast and began instructing pt in same today. Included educating pt in basics of anatomy of lymphatic system and basic principles of MLD. She was able to verbalize a good understanding by end of session. She also asked about when to wear her compression sleeve and educated her that for now when she works out increasing her HR or any times of highly repetitive activities.   OBJECTIVE IMPAIRMENTS decreased knowledge of condition, decreased knowledge of use of DME, decreased ROM, increased edema, increased fascial restrictions, impaired UE functional use, postural dysfunction, and pain.   ACTIVITY LIMITATIONS reach over head  PARTICIPATION LIMITATIONS:  gym activities  PERSONAL FACTORS  none  are also affecting patient's functional outcome.   REHAB POTENTIAL: Good  CLINICAL DECISION MAKING: Stable/uncomplicated  EVALUATION COMPLEXITY: Low  GOALS: Goals reviewed with patient? Yes  SHORT TERM GOALS=LONG TERM GOALS Target date: 07/09/2022     Pt will demonstrate 175 degrees of L shoulder abduction to allow pt to reach out to the side. Baseline:167 Goal status: INITIAL  2.  Pt will report she is no longer having pain in her L shoulder with end range of motion. Baseline:  Goal status: INITIAL  3.  Pt will be independent in MLD for long term management of lymphedema. Baseline:  Goal status: INITIAL  4.  Pt will obtain appropriate sleeve and glove to use for compression during overseas flight.  Baseline:  Goal status: INITIAL  PLAN: PT FREQUENCY: 2x/week  PT DURATION: 4 weeks  PLANNED INTERVENTIONS: Therapeutic exercises, Patient/Family education, Self Care, Joint mobilization, Manual lymph drainage, Compression bandaging, scar mobilization, Vasopneumatic device, Manual therapy, and Re-evaluation  PLAN FOR NEXT SESSION: P/ROM to Lt shoulder to decrease pain and tightness. Cont and review MLD for L breast and add left upper arm prn, assess sleeve and glove for fit once she obtains them.   Otelia Limes, PTA 06/11/2022, 9:23 AM    Self manual lymph drainage:   Cancer Rehab [phone] Perform this sequence once a day.  Only give enough pressure to your skin to make the skin move.  Hug yourself.  Do circles at your neck just above your collarbones.  Repeat this 10 times.  Diaphragmatic - Supine   Inhale through nose making navel move out toward hands. Exhale through puckered lips, hands follow navel in. Repeat _5__ times. Rest _10__ seconds between repeats.   Copyright  VHI. All rights reserved.    Axilla - One at a Time   Using full weight of flat hand and fingers at center of uninvolved armpit, make _10__ in-place circles.   Copyright  VHI. All rights reserved.  LEG: Inguinal Nodes Stimulation   With small  finger side of hand against hip crease on involved side, gently perform circles at the crease. Repeat __10_ times.   Copyright  VHI. All rights reserved.  Axilla to Inguinal Nodes -  Sweep   On involved side, pump _4__ times from armpit along side of trunk to hip crease.  Now gently stretch skin from the involved side to the uninvolved side across the chest at the shoulder line.  Repeat that 4 times.  Draw an imaginary diagonal line from upper outer breast through the nipple area toward lower inner breast.  Direct fluid upward and inward from this line toward the pathway across your upper chest .  Do this in three rows to treat all of the upper inner breast tissue, and do each row 3-4x.      Direct fluid to treat all of lower outer breast tissue downward and outward toward  pathway that is aimed at the left groin.  Finish by doing the pathways as described above going from your involved armpit to the same side groin and going across your upper chest from the involved shoulder to the uninvolved shoulder.  Repeat the steps above where you do circles in your left groin and right armpit.

## 2022-06-11 NOTE — Patient Instructions (Signed)
Self manual lymph drainage:        Cancer Rehab [phone] Perform this sequence once a day.  Only give enough pressure to your skin to make the skin move.  Hug yourself.  Do circles at your neck just above your collarbones.  Repeat this 10 times.  Diaphragmatic - Supine   Inhale through nose making navel move out toward hands. Exhale through puckered lips, hands follow navel in. Repeat _5__ times. Rest _10__ seconds between repeats.   Copyright  VHI. All rights reserved.    Axilla - One at a Time   Using full weight of flat hand and fingers at center of uninvolved armpit, make _10__ in-place circles.   Copyright  VHI. All rights reserved.  LEG: Inguinal Nodes Stimulation   With small finger side of hand against hip crease on involved side, gently perform circles at the crease. Repeat __10_ times.   Copyright  VHI. All rights reserved.  Axilla to Inguinal Nodes - Sweep   On involved side, pump _4__ times from armpit along side of trunk to hip crease.  Now gently stretch skin from the involved side to the uninvolved side across the chest at the shoulder line.  Repeat that 4 times.  Draw an imaginary diagonal line from upper outer breast through the nipple area toward lower inner breast.  Direct fluid upward and inward from this line toward the pathway across your upper chest .  Do this in three rows to treat all of the upper inner breast tissue, and do each row 3-4x.      Direct fluid to treat all of lower outer breast tissue downward and outward toward      pathway that is aimed at the left groin.  Finish by doing the pathways as described above going from your involved armpit to the same side groin and going across your upper chest from the involved shoulder to the uninvolved shoulder.  Repeat the steps above where you do circles in your left groin and right armpit. Copyright  VHI. All rights reserved.   

## 2022-06-15 ENCOUNTER — Ambulatory Visit: Payer: 59

## 2022-06-15 DIAGNOSIS — M25512 Pain in left shoulder: Secondary | ICD-10-CM

## 2022-06-15 DIAGNOSIS — M25612 Stiffness of left shoulder, not elsewhere classified: Secondary | ICD-10-CM

## 2022-06-15 DIAGNOSIS — I890 Lymphedema, not elsewhere classified: Secondary | ICD-10-CM | POA: Diagnosis not present

## 2022-06-15 NOTE — Therapy (Signed)
OUTPATIENT PHYSICAL THERAPY ONCOLOGY TREATMENT  Patient Name: Amanda Wolf MRN: [id number] DOB:Aug 24, 1956, 66 y.o., female Today's Date: 06/15/2022   PT End of Session - 06/15/22 1611     Visit Number 4    Number of Visits 9    Date for PT Re-Evaluation 07/01/22    PT Start Time 5643    PT Stop Time 3295    PT Time Calculation (min) 57 min    Activity Tolerance Patient tolerated treatment well    Behavior During Therapy [facility] for tasks assessed/performed             Past Medical History:  Diagnosis Date   Cancer ([place]) 02/2021   left breast DCIS   Family history of breast cancer    Family history of colon cancer    Hypercholesteremia    Osteopenia    Osteopenia    Past Surgical History:  Procedure Laterality Date   BREAST BIOPSY Left 1980   BREAST LUMPECTOMY WITH RADIOACTIVE SEED LOCALIZATION Left 03/09/2021   Procedure: LEFT BREAST LUMPECTOMY WITH RADIOACTIVE SEED LOCALIZATION;  Surgeon: Amanda Keens, MD;  Location: [place];  Service: General;  Laterality: Left;   uterine cyst  1990   Patient Active Problem List   Diagnosis Date Noted   Genetic testing 02/23/2021   Family history of breast cancer    Family history of colon cancer    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   Lower back injury, subsequent encounter 12/29/2016    PCP: Amanda Drivers, PA  REFERRING PROVIDER: Salomon Fick, NP  REFERRING DIAG: 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    PERTINENT HISTORY:  Hx of L lumpectomy 03/09/21 and radiation completed 07/2021 for breast cancer, reexcision on 04/17/21 and 05/01/21; no lymph nodes removed PAIN:  Are you having pain? No NPRS scale: 0/10 Pain location: L shoulder Pain orientation: Left  PAIN TYPE:  Pain description:  Aggravating factors:  Relieving factors:   PRECAUTIONS: Other: lymphedema  WEIGHT BEARING RESTRICTIONS No  FALLS:  Has patient fallen in last 6 months? Yes. Number of falls 1 pt reports she dripped water on the floor and fell in it  LIVING ENVIRONMENT: Lives with: lives alone Lives in: House/apartment Has following equipment at home: None  OCCUPATION: retired  LEISURE: pt exercises 6 days/wk, 3 days/wk weight training and the rest of the time she walks 2 miles  HAND DOMINANCE : right   PRIOR LEVEL OF FUNCTION: Independent  PATIENT GOALS to not wake up with a swollen breast, to wake up without pain   OBJECTIVE  COGNITION:  Overall cognitive status: Within functional limits for tasks assessed   PALPATION: No fibrosis present in L breast currently  OBSERVATIONS / OTHER ASSESSMENTS: L breast larger than right  POSTURE: forward head  UPPER EXTREMITY AROM/PROM:  A/PROM RIGHT   eval   Shoulder extension 78  Shoulder flexion 162  Shoulder abduction 180  Shoulder internal rotation 69  Shoulder external rotation 88    (Blank rows = not tested)  A/PROM LEFT   eval  Shoulder extension 65  Shoulder flexion 163  Shoulder abduction 167  Shoulder internal rotation 77  Shoulder external rotation 88    (Blank rows = not tested)   UPPER EXTREMITY STRENGTH: 5/5   LYMPHEDEMA ASSESSMENTS:   SURGERY TYPE/DATE: 03/09/21 - L lumpectomy, reexcision 04/17/22, reexcision 05/01/22  NUMBER OF LYMPH NODES REMOVED: none due to DCIS  CHEMOTHERAPY:  none  RADIATION: completed 06/11/21-07/08/21  HORMONE TREATMENT: on Tamoxifen 07/22/21  INFECTIONS: none  LYMPHEDEMA ASSESSMENTS:   LANDMARK RIGHT  eval  10 cm proximal to olecranon process 25  Olecranon process 23.4  10 cm proximal to ulnar styloid process 19  Just proximal to ulnar styloid process 14.5  Across hand at thumb web space 18.7  At base of 2nd digit 5.9  (Blank rows = not tested)  LANDMARK LEFT  eval  10 cm proximal to olecranon process 24.6  Olecranon process 22.7  10 cm proximal to ulnar styloid process 18.6  Just proximal to ulnar styloid process 13.7  Across hand at thumb web space 18.8  At base of 2nd digit 6  (Blank rows = not tested)  QUICK DASH SURVEY:     BREAST COMPLAINTS SCALE: 3   TODAY'S TREATMENT   06/11/22 Self Care Pt brought her other compression sleeve that arrived and assessed fit and instructed her in proper donning technique, then had her return demo. This is the max size which is a great fit and length so pt will keep this one and return the other. Answered her questions about and spent time discussing importance of wearing her compression garments when flying and then some once she arrives as she will be walking a lot more than usual. Pt able to verbalize good understanding.  Manual Therapy MLD to Left breast: Short neck, 5 diaphragmatic breaths (pt with improved technique but does still require cuing), Rt axillary LN's, left inguinal LN's, anterior inter-axillary pathway, left axillo-inguinal pathway and left breast redirecting to appropriate pathways, and finished retracing steps in supine and ending with LN's.  P/ROM of Lt shoulder into flexion, abduction and D2 into pts available end motion with scapular depression throughout MFR to Lt axilla during P/ROM  06/09/2022 Showed pt the proper way to don/doff compression sleeve and glove Size 1 reg. Amanda Wolf. Showed how to use rubber gloves for improved ease Instructed Pt in MLD to the left  breast;Short neck, 5 diaphragmatic breaths, activated bilateral axillary LN's, left  inguinal LN's, anterior interaxillary pathway, left axillo-inguinal pathway and left breast redirecting to appropriate pathways and ending with LN's. Explained lightness of techniques and showed poster how superficial lymphatics are just below the skin. Gave handout with pictures    06/03/22- measured pt for compression sleeve and glove for [place] 2 [place] and issued info for her to obtain from compression guru - she was in between a Juzo Dynamic size 1 and size 1 max so pt ordered both and will see which one fits best and a juzo basic glove size small. Pt did not fit well in to the medi harmony so decided on Juzo. Educated pt in correct donning technique and educated her to try on size 1 once it arrives.   PATIENT EDUCATION:  Education details: ordering compression garments, wearing compression bra,  Self MLD to left breast Person educated: Patient Education method: Explanation and Handouts Education comprehension: verbalized understanding   HOME EXERCISE PROGRAM: Wear compression bra and try compression sleeve when it arrives  ASSESSMENT:  CLINICAL IMPRESSION: Continued with MLD to Lt breast, P/ROM of Lt shoulder and MFR to Lt axilla where pt palpably tight. She is flying to [place] Friday so will have one more visit tomorrow and then placed on hold until she returns.   OBJECTIVE IMPAIRMENTS decreased knowledge of condition, decreased knowledge of use of DME, decreased ROM, increased edema, increased fascial restrictions, impaired UE functional use, postural dysfunction, and pain.   ACTIVITY LIMITATIONS reach over head  PARTICIPATION LIMITATIONS:  gym activities  PERSONAL FACTORS  none  are also affecting patient's functional outcome.   REHAB POTENTIAL: Good  CLINICAL DECISION MAKING: Stable/uncomplicated  EVALUATION COMPLEXITY: Low  GOALS: Goals reviewed with patient? Yes  SHORT TERM  GOALS=LONG TERM GOALS Target date: 07/13/2022    Pt will demonstrate 175 degrees of L shoulder abduction to allow pt to reach out to the side. Baseline:167 Goal status: INITIAL  2.  Pt will report she is no longer having pain in her L shoulder with end range of motion. Baseline:  Goal status: INITIAL  3.  Pt will be independent in MLD for long term management of lymphedema. Baseline:  Goal status: INITIAL  4.  Pt will obtain appropriate sleeve and glove to use for compression during overseas flight.  Baseline:  Goal status: INITIAL  PLAN: PT FREQUENCY: 2x/week  PT DURATION: 4 weeks  PLANNED INTERVENTIONS: Therapeutic exercises, Patient/Family education, Self Care, Joint mobilization, Manual lymph drainage, Compression bandaging, scar mobilization, Vasopneumatic device, Manual therapy, and Re-evaluation  PLAN FOR NEXT SESSION: Pt on hold after next visit due to traveling out of country. P/ROM to Lt shoulder to decrease pain and tightness. Cont and review MLD for L breast and add left upper arm prn.   Otelia Limes, PTA 06/15/2022, 5:18 PM    Self manual lymph drainage:   Cancer Rehab [phone] Perform this sequence once a day.  Only give enough pressure to your skin to make the skin move.  Hug yourself.  Do circles at your neck just above your collarbones.  Repeat this 10 times.  Diaphragmatic - Supine   Inhale through nose making navel move out toward hands. Exhale through puckered lips, hands follow navel in. Repeat _5__ times. Rest _10__ seconds between repeats.   Copyright  VHI. All rights reserved.    Axilla - One at a Time   Using full weight of flat hand and fingers at center of uninvolved armpit, make _10__ in-place circles.   Copyright  VHI. All rights reserved.  LEG: Inguinal Nodes Stimulation   With small finger side of hand against hip crease on involved side, gently perform circles at the crease. Repeat __10_ times.   Copyright  VHI.  All rights reserved.  Axilla to Inguinal Nodes - Sweep   On involved side, pump _4__ times from armpit along side of trunk to hip crease.  Now gently stretch skin from the involved side to the uninvolved side across the chest at the shoulder line.  Repeat that 4 times.  Draw an imaginary diagonal line from upper outer breast through the nipple area toward lower inner breast.  Direct fluid upward and inward from this line toward the pathway across your upper chest .  Do this in three rows to treat all of the upper inner breast tissue, and do each row 3-4x.      Direct fluid to treat all of lower outer breast tissue downward and outward toward  pathway that is aimed at the left groin.  Finish by doing the pathways as described above going from your involved armpit to the same side groin and going across your upper chest from the involved shoulder to the uninvolved shoulder.  Repeat the steps above where you do circles in your left groin and right armpit.

## 2022-06-16 ENCOUNTER — Ambulatory Visit: Payer: 59 | Admitting: Rehabilitation

## 2022-06-16 ENCOUNTER — Encounter: Payer: Self-pay | Admitting: Rehabilitation

## 2022-06-16 DIAGNOSIS — I890 Lymphedema, not elsewhere classified: Secondary | ICD-10-CM | POA: Diagnosis not present

## 2022-06-16 DIAGNOSIS — M25512 Pain in left shoulder: Secondary | ICD-10-CM

## 2022-06-16 DIAGNOSIS — M25612 Stiffness of left shoulder, not elsewhere classified: Secondary | ICD-10-CM

## 2022-06-16 NOTE — Therapy (Signed)
OUTPATIENT PHYSICAL THERAPY ONCOLOGY TREATMENT  Patient Name: Amanda Wolf MRN: [id number] DOB:November 13, 1955, 66 y.o., female Today's Date: 06/16/2022   PT End of Session - 06/16/22 1557     Visit Number 5    Number of Visits 9    Date for PT Re-Evaluation 07/01/22    PT Start Time 1600    PT Stop Time 0350    PT Time Calculation (min) 55 min    Activity Tolerance Patient tolerated treatment well    Behavior During Therapy [facility] for tasks assessed/performed             Past Medical History:  Diagnosis Date   Cancer ([place]) 02/2021   left breast DCIS   Family history of breast cancer    Family history of colon cancer    Hypercholesteremia    Osteopenia    Osteopenia    Past Surgical History:  Procedure Laterality Date   BREAST BIOPSY Left 1980   BREAST LUMPECTOMY WITH RADIOACTIVE SEED LOCALIZATION Left 03/09/2021   Procedure: LEFT BREAST LUMPECTOMY WITH RADIOACTIVE SEED LOCALIZATION;  Surgeon: Coralie Keens, MD;  Location: [place];  Service: General;  Laterality: Left;   uterine cyst  1990   Patient Active Problem List   Diagnosis Date Noted   Genetic testing 02/23/2021   Family history of breast cancer    Family history of colon cancer    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   Lower back injury, subsequent encounter 12/29/2016    PCP: Marilynne Drivers, PA  REFERRING PROVIDER: Salomon Fick, NP  REFERRING DIAG: 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)   Lymphedema of breast I89.0   Decreased range of motion of left shoulder (M25.612)  THERAPY DIAG:  Lymphedema, not elsewhere classified  Stiffness of left shoulder, not elsewhere classified  Acute pain of left shoulder  ONSET DATE: 04/17/22  Rationale for Evaluation and Treatment Rehabilitation  SUBJECTIVE                                                                                                                                                                                            SUBJECTIVE STATEMENT: Nothing new  PERTINENT HISTORY:  Hx of L lumpectomy 03/09/21 and radiation completed 07/2021 for breast cancer, reexcision on 04/17/21 and 05/01/21; no lymph nodes removed PAIN:  Are you having pain? No NPRS scale: 0/10 Pain location: L shoulder Pain orientation: Left  PAIN TYPE:  Pain description:  Aggravating factors:  Relieving factors:   PRECAUTIONS: Other: lymphedema  WEIGHT BEARING RESTRICTIONS No  FALLS:  Has patient fallen in last 6 months? Yes.  Number of falls 1 pt reports she dripped water on the floor and fell in it  LIVING ENVIRONMENT: Lives with: lives alone Lives in: House/apartment Has following equipment at home: None  OCCUPATION: retired  LEISURE: pt exercises 6 days/wk, 3 days/wk weight training and the rest of the time she walks 2 miles  HAND DOMINANCE : right   PRIOR LEVEL OF FUNCTION: Independent  PATIENT GOALS to not wake up with a swollen breast, to wake up without pain   OBJECTIVE  COGNITION:  Overall cognitive status: Within functional limits for tasks assessed   PALPATION: No fibrosis present in L breast currently  OBSERVATIONS / OTHER ASSESSMENTS: L breast larger than right  POSTURE: forward head  UPPER EXTREMITY AROM/PROM:  A/PROM RIGHT   eval   Shoulder extension 78  Shoulder flexion 162  Shoulder abduction 180  Shoulder internal rotation 69  Shoulder external rotation 88    (Blank rows = not tested)  A/PROM LEFT   eval   Shoulder extension 65   Shoulder flexion 163 160 - pain  Shoulder abduction 167 165  Shoulder internal rotation 77   Shoulder external rotation 88     (Blank rows = not tested)  UPPER EXTREMITY STRENGTH: 5/5  LYMPHEDEMA ASSESSMENTS:  SURGERY TYPE/DATE: 03/09/21 - L lumpectomy, reexcision 04/17/22, reexcision 05/01/22 NUMBER OF LYMPH NODES REMOVED: none due to DCIS CHEMOTHERAPY: none RADIATION: completed 06/11/21-07/08/21 HORMONE TREATMENT: on  Tamoxifen 07/22/21 INFECTIONS: none  LYMPHEDEMA ASSESSMENTS:   LANDMARK RIGHT  eval  10 cm proximal to olecranon process 25  Olecranon process 23.4  10 cm proximal to ulnar styloid process 19  Just proximal to ulnar styloid process 14.5  Across hand at thumb web space 18.7  At base of 2nd digit 5.9  (Blank rows = not tested)  LANDMARK LEFT  eval  10 cm proximal to olecranon process 24.6  Olecranon process 22.7  10 cm proximal to ulnar styloid process 18.6  Just proximal to ulnar styloid process 13.7  Across hand at thumb web space 18.8  At base of 2nd digit 6  (Blank rows = not tested)  BREAST COMPLAINTS SCALE: 3  TODAY'S TREATMENT  06/16/22 Therapeutic Exercise: Pulleys into flexion and abduction x 61mn each (easy) Pt will be bringing resistance band on trip so we discussed: bil ER, horizontal abduction, row, and flexion movements.  Manual Therapy MLD to Left breast: Short neck, 5 diaphragmatic breaths (pt with improved technique but does still require cuing), bil axillary LN's, left inguinal LN's, anterior inter-axillary pathway, left axillo-inguinal pathway and left breast redirecting to appropriate pathways and intact Lt axilla, then posterior work in side lying and finished retracing steps in supine and ending with LN's.  P/ROM of Lt shoulder into flexion, abduction and D2 into pts available end motion with scapular depression throughout MFR to Lt axilla during P/ROM  06/11/22 Self Care Pt brought her other compression sleeve that arrived and assessed fit and instructed her in proper donning technique, then had her return demo. This is the max size which is a great fit and length so pt will keep this one and return the other. Answered her questions about and spent time discussing importance of wearing her compression garments when flying and then some once she arrives as she will be walking a lot more than usual. Pt able to verbalize good understanding.  Manual Therapy MLD  to Left breast: Short neck, 5 diaphragmatic breaths (pt with improved technique but does still require cuing), Rt axillary LN's, left  inguinal LN's, anterior inter-axillary pathway, left axillo-inguinal pathway and left breast redirecting to appropriate pathways, and finished retracing steps in supine and ending with LN's.  P/ROM of Lt shoulder into flexion, abduction and D2 into pts available end motion with scapular depression throughout MFR to Lt axilla during P/ROM  06/09/2022 Showed pt the proper way to don/doff compression sleeve and glove Size 1 reg. [name] Showed how to use rubber gloves for improved ease Instructed Pt in MLD to the left breast;Short neck, 5 diaphragmatic breaths, activated bilateral axillary LN's, left inguinal LN's, anterior interaxillary pathway, left axillo-inguinal pathway and left breast redirecting to appropriate pathways and ending with LN's. Explained lightness of techniques and showed poster how superficial lymphatics are just below the skin. Gave handout with pictures   PATIENT EDUCATION:  Education details: ordering compression garments, wearing compression bra,  Self MLD to left breast Person educated: Patient Education method: Explanation and Handouts Education comprehension: verbalized understanding   HOME EXERCISE PROGRAM: Wear compression bra and try compression sleeve when it arrives  ASSESSMENT:  CLINICAL IMPRESSION: Continued POC with tightness in pectoralis and latissimus and mild edema of the breast.    OBJECTIVE IMPAIRMENTS decreased knowledge of condition, decreased knowledge of use of DME, decreased ROM, increased edema, increased fascial restrictions, impaired UE functional use, postural dysfunction, and pain.   ACTIVITY LIMITATIONS reach over head  PARTICIPATION LIMITATIONS:  gym activities  PERSONAL FACTORS  none  are also affecting patient's functional outcome.   REHAB POTENTIAL: Good  CLINICAL DECISION MAKING:  Stable/uncomplicated  EVALUATION COMPLEXITY: Low  GOALS: Goals reviewed with patient? Yes  SHORT TERM GOALS=LONG TERM GOALS Target date: 07/14/2022    Pt will demonstrate 175 degrees of L shoulder abduction to allow pt to reach out to the side. Baseline:167 Goal status: INITIAL  2.  Pt will report she is no longer having pain in her L shoulder with end range of motion. Baseline:  Goal status: INITIAL  3.  Pt will be independent in MLD for long term management of lymphedema. Baseline:  Goal status: INITIAL  4.  Pt will obtain appropriate sleeve and glove to use for compression during overseas flight.  Baseline:  Goal status: INITIAL  PLAN: PT FREQUENCY: 2x/week  PT DURATION: 4 weeks  PLANNED INTERVENTIONS: Therapeutic exercises, Patient/Family education, Self Care, Joint mobilization, Manual lymph drainage, Compression bandaging, scar mobilization, Vasopneumatic device, Manual therapy, and Re-evaluation  PLAN FOR NEXT SESSION: Pt on hold after next visit due to traveling out of country. P/ROM to Lt shoulder to decrease pain and tightness. Cont and review MLD for L breast and add left upper arm prn.   Stark Bray, PT 06/16/2022, 5:10 PM    Self manual lymph drainage:   Cancer Rehab [phone] Perform this sequence once a day.  Only give enough pressure to your skin to make the skin move.  Hug yourself.  Do circles at your neck just above your collarbones.  Repeat this 10 times.  Diaphragmatic - Supine   Inhale through nose making navel move out toward hands. Exhale through puckered lips, hands follow navel in. Repeat _5__ times. Rest _10__ seconds between repeats.   Copyright  VHI. All rights reserved.    Axilla - One at a Time   Using full weight of flat hand and fingers at center of uninvolved armpit, make _10__ in-place circles.   Copyright  VHI. All rights reserved.  LEG: Inguinal Nodes Stimulation   With small finger side of hand against hip crease on  involved side, gently perform circles at the crease. Repeat __10_ times.   Copyright  VHI. All rights reserved.  Axilla to Inguinal Nodes - Sweep   On involved side, pump _4__ times from armpit along side of trunk to hip crease.  Now gently stretch skin from the involved side to the uninvolved side across the chest at the shoulder line.  Repeat that 4 times.  Draw an imaginary diagonal line from upper outer breast through the nipple area toward lower inner breast.  Direct fluid upward and inward from this line toward the pathway across your upper chest .  Do this in three rows to treat all of the upper inner breast tissue, and do each row 3-4x.      Direct fluid to treat all of lower outer breast tissue downward and outward toward  pathway that is aimed at the left groin.  Finish by doing the pathways as described above going from your involved armpit to the same side groin and going across your upper chest from the involved shoulder to the uninvolved shoulder.  Repeat the steps above where you do circles in your left groin and right armpit.

## 2022-07-08 ENCOUNTER — Ambulatory Visit: Payer: 59 | Attending: Adult Health | Admitting: Rehabilitation

## 2022-07-08 ENCOUNTER — Encounter: Payer: Self-pay | Admitting: Rehabilitation

## 2022-07-08 DIAGNOSIS — M25612 Stiffness of left shoulder, not elsewhere classified: Secondary | ICD-10-CM | POA: Insufficient documentation

## 2022-07-08 DIAGNOSIS — I890 Lymphedema, not elsewhere classified: Secondary | ICD-10-CM | POA: Diagnosis not present

## 2022-07-08 DIAGNOSIS — D0512 Intraductal carcinoma in situ of left breast: Secondary | ICD-10-CM | POA: Diagnosis not present

## 2022-07-08 DIAGNOSIS — M25512 Pain in left shoulder: Secondary | ICD-10-CM | POA: Diagnosis not present

## 2022-07-08 NOTE — Therapy (Signed)
OUTPATIENT PHYSICAL THERAPY ONCOLOGY TREATMENT  Patient Name: Amanda Wolf MRN: [id number] DOB:[birth year], 66 y.o., female Today's Date: 07/08/2022   PT End of Session - 07/08/22 1103     Visit Number 6    Number of Visits 9    Date for PT Re-Evaluation 07/01/22    PT Start Time 1104    PT Stop Time 1139    PT Time Calculation (min) 35 min    Activity Tolerance Patient tolerated treatment well    Behavior During Therapy [facility] for tasks assessed/performed              Past Medical History:  Diagnosis Date   Cancer ([place]) 02/2021   left breast DCIS   Family history of breast cancer    Family history of colon cancer    Hypercholesteremia    Osteopenia    Osteopenia    Past Surgical History:  Procedure Laterality Date   BREAST BIOPSY Left 1980   BREAST LUMPECTOMY WITH RADIOACTIVE SEED LOCALIZATION Left 03/09/2021   Procedure: LEFT BREAST LUMPECTOMY WITH RADIOACTIVE SEED LOCALIZATION;  Surgeon: Coralie Keens, MD;  Location: [facility];  Service: General;  Laterality: Left;   uterine cyst  1990   Patient Active Problem List   Diagnosis Date Noted   Genetic testing 02/23/2021   Family history of breast cancer    Family history of colon cancer    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   Lower back injury, subsequent encounter 12/29/2016    PCP: Marilynne Drivers, PA  REFERRING PROVIDER: Salomon Fick, NP  REFERRING DIAG: 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 She said she noticed no additional swelling. Her shoulder is doing great and she has had no pain in her shoulder. She only notice minimal swelling around the breast.   PERTINENT HISTORY:  Hx of L lumpectomy 03/09/21 and radiation completed 07/2021 for breast cancer, reexcision on 04/17/21 and 05/01/21; no lymph nodes removed PAIN:  PAIN:  Are you having pain? No  PRECAUTIONS: Other: lymphedema  WEIGHT BEARING RESTRICTIONS No  FALLS:  Has patient fallen in last 6 months? Yes. Number of falls 1 pt reports she dripped water on the floor and fell in it  LIVING ENVIRONMENT: Lives with: lives alone Lives in: House/apartment Has following equipment at home: None  OCCUPATION: retired  LEISURE: pt exercises 6 days/wk, 3 days/wk weight training and the rest of the time she walks 2 miles  HAND DOMINANCE : right   PRIOR LEVEL OF FUNCTION: Independent  PATIENT GOALS to not wake up with a swollen breast, to wake up without pain   OBJECTIVE  COGNITION:  Overall cognitive status: Within functional limits for tasks assessed   PALPATION: No fibrosis present in L breast currently  OBSERVATIONS / OTHER ASSESSMENTS: L breast larger than right  POSTURE: forward head  UPPER EXTREMITY AROM/PROM:  A/PROM RIGHT   eval   Shoulder extension 78  Shoulder flexion 162  Shoulder abduction 180  Shoulder internal rotation 69  Shoulder external rotation 88    (Blank rows = not tested)  A/PROM LEFT   eval   Shoulder extension 65   Shoulder flexion 163 160 - pain  Shoulder abduction 167 165  Shoulder internal rotation 77   Shoulder external rotation 88     (Blank rows = not tested)  UPPER EXTREMITY STRENGTH: 5/5  LYMPHEDEMA ASSESSMENTS:  SURGERY TYPE/DATE: 03/09/21 - L lumpectomy, reexcision 04/17/22, reexcision 05/01/22 NUMBER OF LYMPH NODES REMOVED: none due to DCIS CHEMOTHERAPY: none RADIATION:  completed 06/11/21-07/08/21 HORMONE TREATMENT: on Tamoxifen 07/22/21 INFECTIONS: none  LYMPHEDEMA ASSESSMENTS:   LANDMARK RIGHT  eval  10 cm proximal to olecranon process 25  Olecranon process 23.4  10 cm proximal to ulnar styloid process 19  Just proximal to ulnar styloid process 14.5  Across hand at thumb web space 18.7  At base of 2nd digit 5.9  (Blank rows = not tested)  LANDMARK LEFT  eval  10 cm proximal to olecranon process 24.6  Olecranon process 22.7  10 cm proximal to ulnar styloid process 18.6  Just proximal to ulnar styloid process 13.7  Across hand at thumb web space 18.8  At base of 2nd digit 6  (Blank rows = not tested)  BREAST COMPLAINTS SCALE: 3  TODAY'S TREATMENT   07/08/2022 Manual Therapy MLD to Left breast: Short neck, 5 diaphragmatic breaths (pt with improved technique but does still require cuing), bil axillary LN's, left inguinal LN's, anterior inter-axillary pathway, left axillo-inguinal pathway and left breast redirecting to appropriate pathways and intact Lt axilla, then posterior work in side lying and finished retracing steps in supine and ending with LN's.  P/ROM of Lt shoulder into flexion, abduction and D2 into pts available end motion with scapular depression throughout MFR to Lt axilla during P/ROM  06/16/22 Therapeutic Exercise: Pulleys into flexion and abduction x 50mn each (easy) Pt will be bringing resistance band on trip so we discussed: bil ER, horizontal abduction, row, and flexion movements.  Manual Therapy MLD to Left breast: Short neck, 5 diaphragmatic breaths (pt with improved technique but does still require cuing), bil axillary LN's, left inguinal LN's, anterior inter-axillary pathway, left axillo-inguinal pathway and left breast redirecting to appropriate pathways and intact Lt axilla, then posterior work in side lying and finished retracing steps in supine and ending with LN's.  P/ROM of Lt shoulder into flexion, abduction and D2  into pts available end motion with scapular depression throughout MFR to Lt axilla during P/ROM  06/11/22 Self Care Pt brought her other compression sleeve  that arrived and assessed fit and instructed her in proper donning technique, then had her return demo. This is the max size which is a great fit and length so pt will keep this one and return the other. Answered her questions about and spent time discussing importance of wearing her compression garments when flying and then some once she arrives as she will be walking a lot more than usual. Pt able to verbalize good understanding.  Manual Therapy MLD to Left breast: Short neck, 5 diaphragmatic breaths (pt with improved technique but does still require cuing), Rt axillary LN's, left inguinal LN's, anterior inter-axillary pathway, left axillo-inguinal pathway and left breast redirecting to appropriate pathways, and finished retracing steps in supine and ending with LN's.  P/ROM of Lt shoulder into flexion, abduction and D2 into pts available end motion with scapular depression throughout MFR to Lt axilla during P/ROM  06/09/2022 Showed pt the proper way to don/doff compression sleeve and glove Size 1 reg. [name] Showed how to use rubber gloves for improved ease Instructed Pt in MLD to the left breast;Short neck, 5 diaphragmatic breaths, activated bilateral axillary LN's, left inguinal LN's, anterior interaxillary pathway, left axillo-inguinal pathway and left breast redirecting to appropriate pathways and ending with LN's. Explained lightness of techniques and showed poster how superficial lymphatics are just below the skin. Gave handout with pictures   PATIENT EDUCATION:  Education details: ordering compression garments, wearing compression bra,  Self MLD to left breast Person educated: Patient Education method: Explanation and Handouts Education comprehension: verbalized understanding   HOME EXERCISE PROGRAM: Wear compression bra and try  compression sleeve when it arrives  ASSESSMENT:  CLINICAL IMPRESSION: Pt responded well to therapy today. Pt is satisfied with the progress she has made with therapy. Pt states she comfortable with doing her self MLD and HEP. Pt has met all of her goals. Patient was educated on that if she feels like she has declines she is welcome to come back.  OBJECTIVE IMPAIRMENTS decreased knowledge of condition, decreased knowledge of use of DME, decreased ROM, increased edema, increased fascial restrictions, impaired UE functional use, postural dysfunction, and pain.   ACTIVITY LIMITATIONS reach over head  PARTICIPATION LIMITATIONS:  gym activities  PERSONAL FACTORS  none  are also affecting patient's functional outcome.   REHAB POTENTIAL: Good  CLINICAL DECISION MAKING: Stable/uncomplicated  EVALUATION COMPLEXITY: Low  GOALS: Goals reviewed with patient? Yes  SHORT TERM GOALS/LONG TERM GOALS Target date: 07/14/2022    Pt will demonstrate 175 degrees of L shoulder abduction to allow pt to reach out to the side. Baseline:167 Goal status: MET  2.  Pt will report she is no longer having pain in her L shoulder with end range of motion. Baseline:  Goal status: MET  3.  Pt will be independent in MLD for long term management of lymphedema. Baseline:  Goal status: MET  4.  Pt will obtain appropriate sleeve and glove to use for compression during overseas flight.  Baseline:  Goal status: MET  PLAN: PT FREQUENCY: 2x/week  PT DURATION: 4 weeks  PLANNED INTERVENTIONS: Therapeutic exercises, Patient/Family education, Self Care, Joint mobilization, Manual lymph drainage, Compression bandaging, scar mobilization, Vasopneumatic device, Manual therapy, and Re-evaluation  PLAN FOR NEXT SESSION:  Discharge with HEP  PHYSICAL THERAPY DISCHARGE SUMMARY  Visits from Start of Care: 6  Current functional level related to goals / functional outcomes: See above    Remaining deficits: See above     Education / Equipment: HEP provided  Patient agrees to discharge. Patient goals were met. Patient is being discharged due to meeting the stated rehab goals.   Vontrell Pullman, Student-PT 07/08/2022, 11:42 AM

## 2022-07-13 ENCOUNTER — Ambulatory Visit: Payer: 59

## 2022-07-16 ENCOUNTER — Ambulatory Visit: Payer: 59 | Admitting: Rehabilitation

## 2022-07-26 ENCOUNTER — Encounter: Payer: 59 | Admitting: Rehabilitation

## 2022-08-03 ENCOUNTER — Telehealth: Payer: Self-pay

## 2022-08-03 NOTE — Patient Outreach (Signed)
  Care Coordination   08/03/2022 Name: Amanda Wolf MRN: [id number] DOB: [birth year]   Care Coordination Outreach Attempts:  An unsuccessful telephone outreach was attempted today to offer the patient information about available care coordination services as a benefit of their health plan.   Follow Up Plan:  Additional outreach attempts will be made to offer the patient care coordination information and services.   Encounter Outcome:  No Answer  Care Coordination Interventions Activated:  No    Care Coordination Interventions:  No, not indicated     Jone Baseman, RN, MSN [facility] Care Management Care Management Coordinator Direct Line [phone]

## 2022-08-23 ENCOUNTER — Telehealth: Payer: Self-pay

## 2022-08-23 NOTE — Patient Outreach (Signed)
  Care Coordination   08/23/2022 Name: Amanda Wolf MRN: [id number] DOB: 10-20-1955   Care Coordination Outreach Attempts:  A second unsuccessful outreach was attempted today to offer the patient with information about available care coordination services as a benefit of their health plan.     Follow Up Plan:  Additional outreach attempts will be made to offer the patient care coordination information and services.   Encounter Outcome:  No Answer  Care Coordination Interventions Activated:  No   Care Coordination Interventions:  No, not indicated    Jone Baseman, RN, MSN [facility] Care Management Care Management Coordinator Direct Line [phone]

## 2022-09-09 ENCOUNTER — Telehealth: Payer: Self-pay

## 2022-09-09 NOTE — Patient Outreach (Signed)
  Care Coordination   09/09/2022 Name: TAELYN BROECKER MRN: [id number] DOB: [birth year]   Care Coordination Outreach Attempts:  A third unsuccessful outreach was attempted today to offer the patient with information about available care coordination services as a benefit of their health plan.   Follow Up Plan:  No further outreach attempts will be made at this time. We have been unable to contact the patient to offer or enroll patient in care coordination services  Encounter Outcome:  No Answer   Care Coordination Interventions:  No, not indicated    Jone Baseman, RN, MSN [place] Management Care Management Coordinator Direct Line [phone]

## 2023-01-06 DIAGNOSIS — Z6823 Body mass index (BMI) 23.0-23.9, adult: Secondary | ICD-10-CM | POA: Diagnosis not present

## 2023-01-06 DIAGNOSIS — Z01419 Encounter for gynecological examination (general) (routine) without abnormal findings: Secondary | ICD-10-CM | POA: Diagnosis not present

## 2023-01-09 DIAGNOSIS — Z803 Family history of malignant neoplasm of breast: Secondary | ICD-10-CM | POA: Diagnosis not present

## 2023-01-09 DIAGNOSIS — Z86000 Personal history of in-situ neoplasm of breast: Secondary | ICD-10-CM | POA: Diagnosis not present

## 2023-01-09 DIAGNOSIS — D0512 Intraductal carcinoma in situ of left breast: Secondary | ICD-10-CM | POA: Diagnosis not present

## 2023-01-09 DIAGNOSIS — Z1239 Encounter for other screening for malignant neoplasm of breast: Secondary | ICD-10-CM | POA: Diagnosis not present

## 2023-01-10 DIAGNOSIS — Z5181 Encounter for therapeutic drug level monitoring: Secondary | ICD-10-CM | POA: Diagnosis not present

## 2023-01-10 DIAGNOSIS — Z79810 Long term (current) use of selective estrogen receptor modulators (SERMs): Secondary | ICD-10-CM | POA: Diagnosis not present

## 2023-01-10 DIAGNOSIS — Z9189 Other specified personal risk factors, not elsewhere classified: Secondary | ICD-10-CM | POA: Diagnosis not present

## 2023-01-10 DIAGNOSIS — Z23 Encounter for immunization: Secondary | ICD-10-CM | POA: Diagnosis not present

## 2023-01-10 DIAGNOSIS — D0512 Intraductal carcinoma in situ of left breast: Secondary | ICD-10-CM | POA: Diagnosis not present

## 2023-03-03 DIAGNOSIS — E559 Vitamin D deficiency, unspecified: Secondary | ICD-10-CM | POA: Diagnosis not present

## 2023-03-03 DIAGNOSIS — Z853 Personal history of malignant neoplasm of breast: Secondary | ICD-10-CM | POA: Diagnosis not present

## 2023-03-03 DIAGNOSIS — Z0000 Encounter for general adult medical examination without abnormal findings: Secondary | ICD-10-CM | POA: Diagnosis not present

## 2023-03-03 DIAGNOSIS — E2839 Other primary ovarian failure: Secondary | ICD-10-CM | POA: Diagnosis not present

## 2023-03-03 DIAGNOSIS — E7800 Pure hypercholesterolemia, unspecified: Secondary | ICD-10-CM | POA: Diagnosis not present

## 2023-03-03 DIAGNOSIS — R0789 Other chest pain: Secondary | ICD-10-CM | POA: Diagnosis not present

## 2023-03-03 DIAGNOSIS — K219 Gastro-esophageal reflux disease without esophagitis: Secondary | ICD-10-CM | POA: Diagnosis not present

## 2023-03-07 ENCOUNTER — Other Ambulatory Visit: Payer: Self-pay | Admitting: Family Medicine

## 2023-03-07 DIAGNOSIS — E2839 Other primary ovarian failure: Secondary | ICD-10-CM

## 2023-03-11 DIAGNOSIS — M20002 Unspecified deformity of left finger(s): Secondary | ICD-10-CM | POA: Diagnosis not present

## 2023-03-11 DIAGNOSIS — D7589 Other specified diseases of blood and blood-forming organs: Secondary | ICD-10-CM | POA: Diagnosis not present

## 2023-04-05 DIAGNOSIS — M720 Palmar fascial fibromatosis [Dupuytren]: Secondary | ICD-10-CM | POA: Diagnosis not present

## 2023-06-24 DIAGNOSIS — L03011 Cellulitis of right finger: Secondary | ICD-10-CM | POA: Diagnosis not present

## 2023-06-26 ENCOUNTER — Emergency Department (HOSPITAL_BASED_OUTPATIENT_CLINIC_OR_DEPARTMENT_OTHER)
Admission: EM | Admit: 2023-06-26 | Discharge: 2023-06-26 | Disposition: A | Discharge status: Home / Self Care | Payer: 59 | Attending: Emergency Medicine | Admitting: Emergency Medicine

## 2023-06-26 ENCOUNTER — Encounter (HOSPITAL_BASED_OUTPATIENT_CLINIC_OR_DEPARTMENT_OTHER): Payer: Self-pay

## 2023-06-26 ENCOUNTER — Other Ambulatory Visit: Payer: Self-pay

## 2023-06-26 DIAGNOSIS — Z5189 Encounter for other specified aftercare: Secondary | ICD-10-CM

## 2023-06-26 DIAGNOSIS — L03011 Cellulitis of right finger: Secondary | ICD-10-CM | POA: Diagnosis not present

## 2023-06-26 DIAGNOSIS — Z4801 Encounter for change or removal of surgical wound dressing: Secondary | ICD-10-CM | POA: Insufficient documentation

## 2023-06-26 LAB — CBC WITH DIFFERENTIAL/PLATELET
Abs Immature Granulocytes: 0.03 10*3/uL (ref 0.00–0.07)
Basophils Absolute: 0.1 10*3/uL (ref 0.0–0.1)
Basophils Relative: 1 %
Eosinophils Absolute: 0 10*3/uL (ref 0.0–0.5)
Eosinophils Relative: 1 %
HCT: 36.2 % (ref 36.0–46.0)
Hemoglobin: 12.5 g/dL (ref 12.0–15.0)
Immature Granulocytes: 0 %
Lymphocytes Relative: 18 %
Lymphs Abs: 1.4 10*3/uL (ref 0.7–4.0)
MCH: 34.6 pg — ABNORMAL HIGH (ref 26.0–34.0)
MCHC: 34.5 g/dL (ref 30.0–36.0)
MCV: 100.3 fL — ABNORMAL HIGH (ref 80.0–100.0)
Monocytes Absolute: 0.8 10*3/uL (ref 0.1–1.0)
Monocytes Relative: 10 %
Neutro Abs: 5.5 10*3/uL (ref 1.7–7.7)
Neutrophils Relative %: 70 %
Platelets: 250 10*3/uL (ref 150–400)
RBC: 3.61 MIL/uL — ABNORMAL LOW (ref 3.87–5.11)
RDW: 12.6 % (ref 11.5–15.5)
WBC: 7.7 10*3/uL (ref 4.0–10.5)
nRBC: 0 % (ref 0.0–0.2)

## 2023-06-26 LAB — BASIC METABOLIC PANEL
Anion gap: 12 (ref 5–15)
BUN: 18 mg/dL (ref 8–23)
CO2: 23 mmol/L (ref 22–32)
Calcium: 8.9 mg/dL (ref 8.9–10.3)
Chloride: 97 mmol/L — ABNORMAL LOW (ref 98–111)
Creatinine, Ser: 0.7 mg/dL (ref 0.44–1.00)
GFR, Estimated: >60 mL/min (ref >60)
Glucose, Bld: 97 mg/dL (ref 70–99)
Potassium: 4.4 mmol/L (ref 3.5–5.1)
Sodium: 132 mmol/L — ABNORMAL LOW (ref 135–145)

## 2023-06-26 LAB — SEDIMENTATION RATE: Sed Rate: 29 mm/hr — ABNORMAL HIGH (ref 0–22)

## 2023-06-26 MED ORDER — SODIUM CHLORIDE 0.9 % IV SOLN
2.0000 g | Freq: Once | INTRAVENOUS | Status: AC
  Administered 2023-06-26: 2 g via INTRAVENOUS
  Filled 2023-06-26: qty 20

## 2023-06-26 MED ORDER — CEFDINIR 300 MG PO CAPS: 300.0000 mg | ORAL_CAPSULE | Freq: Two times a day (BID) | ORAL | 0 refills | Status: AC

## 2023-06-26 NOTE — ED Triage Notes (Signed)
The patient stated her right thumb started hurting one week ago. Friday it was swollen and she went to urgent care and oral antibiotics. Then today she had it lanced  at urgent care. Now she has swelling up her forearm into her upper arm. No fever.

## 2023-06-26 NOTE — ED Provider Notes (Addendum)
[place] [facility] Provider Note   CSN: [id number] Arrival date & time: 06/26/23  4696     History Chief Complaint  Patient presents with   Wound Check    HPI Amanda Wolf is a 67 y.o. female presenting for a red streak going up her right arm.  States that she had a paronychia drained from her right thumb but since then has had substantial pain swelling and redness including the streaking up her right arm today.  Denies any systemic fevers chills nausea vomiting syncope shortness of breath..   Patient's recorded medical, surgical, social, medication list and allergies were reviewed in the Snapshot window as part of the initial history.   Review of Systems   Review of Systems  Constitutional:  Negative for chills and fever.  HENT:  Negative for ear pain and sore throat.   Eyes:  Negative for pain and visual disturbance.  Respiratory:  Negative for cough and shortness of breath.   Cardiovascular:  Negative for chest pain and palpitations.  Gastrointestinal:  Negative for abdominal pain and vomiting.  Genitourinary:  Negative for dysuria and hematuria.  Musculoskeletal:  Negative for arthralgias and back pain.  Skin:  Positive for rash. Negative for color change.  Neurological:  Negative for seizures and syncope.  All other systems reviewed and are negative.   Physical Exam Updated Vital Signs BP (!) 146/102   Pulse 75   Temp 98.1 F (36.7 C) (Oral)   Resp 16   Ht 5\' 4"  (1.626 m)   Wt 59 kg   SpO2 99%   BMI 22.33 kg/m  Physical Exam Constitutional:      General: She is not in acute distress.    Appearance: She is not ill-appearing or toxic-appearing.  HENT:     Head: Normocephalic and atraumatic.  Eyes:     Extraocular Movements: Extraocular movements intact.     Pupils: Pupils are equal, round, and reactive to light.  Cardiovascular:     Rate and Rhythm: Normal rate.  Pulmonary:     Effort: No respiratory distress.   Abdominal:     General: Abdomen is flat.  Musculoskeletal:        General: No swelling, deformity or signs of injury.     Cervical back: Normal range of motion. No rigidity.  Skin:    General: Skin is warm and dry.     Findings: Erythema (Patient has a lymphangitic appearing lesion stretching from her right thumb to approximately the level of her right elbow.) present.  Neurological:     General: No focal deficit present.     Mental Status: She is alert and oriented to person, place, and time.  Psychiatric:        Mood and Affect: Mood normal.      ED Course/ Medical Decision Making/ A&P    Procedures Ultrasound ED Soft Tissue  Date/Time: 06/26/2023 10:13 PM  Performed by: Glyn Ade, MD Authorized by: Glyn Ade, MD   Procedure details:    Indications: evaluate for cellulitis     Transverse view:  Visualized   Longitudinal view:  Visualized   Images: archived   Location:    Location: upper extremity     Side:  Right Findings:     no abscess present    cellulitis present    no foreign body present    Medications Ordered in ED Medications  cefTRIAXone (ROCEPHIN) 2 g in sodium chloride 0.9 % 100 mL IVPB (  0 g Intravenous Stopped 06/26/23 2116)    Medical Decision Making:   This is stable 67 year old female presenting with chief complaint of erythema.  She was diagnosed with a paronychia treated with antibiotics ultimately required incision and drainage earlier today. She denies fevers chills nausea vomiting shortness of breath or any other systemic illnesses. Her exam reveals lymphangitis spreading up her right arm. Given failure of multiple antibiotics we will treat with Rocephin IV and observe in the department for 4 hours will plan for repeat physical assessment. During my initial exam performed a point-of-care ultrasound to evaluate for felon.  There is no visible drainable fluid collection on this ultrasound at this time. Reassessment: On  reassessment there is been slight progression of the erythema after administration of IV Rocephin and observation.  Symptomatically patient is stable she is in no acute distress feels comfortable with outpatient care management.  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 Given clinical stability will treat with p.o. third-generation cephalosporin recommend follow-up with PCP if unable to get in with hand surgery in a timely fashion.  Disposition:  I have considered need for hospitalization, however, considering all of the above, I believe this patient is stable for discharge at this time.  Patient/family educated about specific return precautions for given chief complaint and symptoms.  Patient/family educated about follow-up with PCP .     Patient/family expressed understanding of return precautions and need for follow-up. Patient spoken to regarding all imaging and laboratory results and appropriate follow up for these results. All education provided in verbal form with additional information in written form. Time was allowed for answering of patient questions. Patient discharged.    Emergency Department Medication Summary:   Medications  cefTRIAXone (ROCEPHIN) 2 g in sodium chloride 0.9 % 100 mL IVPB (0 g Intravenous Stopped 06/26/23 2116)        Clinical Impression:  1. Wound check, abscess      Discharge   Final Clinical Impression(s) / ED Diagnoses Final diagnoses:  Wound check, abscess    Rx / DC Orders ED Discharge Orders          Ordered    cefdinir (OMNICEF) 300 MG capsule  2 times daily        06/26/23 2207              Glyn Ade, MD 06/26/23 2212    Glyn Ade, MD 06/26/23 2214

## 2023-06-27 ENCOUNTER — Emergency Department (HOSPITAL_COMMUNITY): Payer: 59

## 2023-06-27 ENCOUNTER — Encounter (HOSPITAL_COMMUNITY): Payer: Self-pay

## 2023-06-27 ENCOUNTER — Emergency Department (HOSPITAL_COMMUNITY)
Admission: EM | Admit: 2023-06-27 | Discharge: 2023-06-27 | Discharge status: Against Medical Advice | Payer: 59 | Attending: Emergency Medicine | Admitting: Emergency Medicine

## 2023-06-27 DIAGNOSIS — L089 Local infection of the skin and subcutaneous tissue, unspecified: Secondary | ICD-10-CM | POA: Diagnosis not present

## 2023-06-27 DIAGNOSIS — M79644 Pain in right finger(s): Secondary | ICD-10-CM | POA: Insufficient documentation

## 2023-06-27 DIAGNOSIS — Z5321 Procedure and treatment not carried out due to patient leaving prior to being seen by health care provider: Secondary | ICD-10-CM | POA: Diagnosis not present

## 2023-06-27 LAB — URINALYSIS, W/ REFLEX TO CULTURE (INFECTION SUSPECTED)
Bacteria, UA: NONE SEEN
Bilirubin Urine: NEGATIVE
Glucose, UA: NEGATIVE mg/dL
Hgb urine dipstick: NEGATIVE
Ketones, ur: NEGATIVE mg/dL
Leukocytes,Ua: NEGATIVE
Nitrite: NEGATIVE
Protein, ur: NEGATIVE mg/dL
Specific Gravity, Urine: 1.011 (ref 1.005–1.030)
pH: 7 (ref 5.0–8.0)

## 2023-06-27 LAB — CBC WITH DIFFERENTIAL/PLATELET
Abs Immature Granulocytes: 0.01 10*3/uL (ref 0.00–0.07)
Basophils Absolute: 0 10*3/uL (ref 0.0–0.1)
Basophils Relative: 1 %
Eosinophils Absolute: 0 10*3/uL (ref 0.0–0.5)
Eosinophils Relative: 0 %
HCT: 37.2 % (ref 36.0–46.0)
Hemoglobin: 12.5 g/dL (ref 12.0–15.0)
Immature Granulocytes: 0 %
Lymphocytes Relative: 20 %
Lymphs Abs: 1.1 10*3/uL (ref 0.7–4.0)
MCH: 33.7 pg (ref 26.0–34.0)
MCHC: 33.6 g/dL (ref 30.0–36.0)
MCV: 100.3 fL — ABNORMAL HIGH (ref 80.0–100.0)
Monocytes Absolute: 0.8 10*3/uL (ref 0.1–1.0)
Monocytes Relative: 15 %
Neutro Abs: 3.5 10*3/uL (ref 1.7–7.7)
Neutrophils Relative %: 64 %
Platelets: 250 10*3/uL (ref 150–400)
RBC: 3.71 MIL/uL — ABNORMAL LOW (ref 3.87–5.11)
RDW: 12.9 % (ref 11.5–15.5)
WBC: 5.4 10*3/uL (ref 4.0–10.5)
nRBC: 0 % (ref 0.0–0.2)

## 2023-06-27 LAB — COMPREHENSIVE METABOLIC PANEL
ALT: 20 U/L (ref 0–44)
AST: 25 U/L (ref 15–41)
Albumin: 4 g/dL (ref 3.5–5.0)
Alkaline Phosphatase: 69 U/L (ref 38–126)
Anion gap: 9 (ref 5–15)
BUN: 14 mg/dL (ref 8–23)
CO2: 24 mmol/L (ref 22–32)
Calcium: 9 mg/dL (ref 8.9–10.3)
Chloride: 99 mmol/L (ref 98–111)
Creatinine, Ser: 0.77 mg/dL (ref 0.44–1.00)
GFR, Estimated: >60 mL/min (ref >60)
Glucose, Bld: 100 mg/dL — ABNORMAL HIGH (ref 70–99)
Potassium: 4 mmol/L (ref 3.5–5.1)
Sodium: 132 mmol/L — ABNORMAL LOW (ref 135–145)
Total Bilirubin: 0.6 mg/dL (ref 0.3–1.2)
Total Protein: 7.3 g/dL (ref 6.5–8.1)

## 2023-06-27 LAB — I-STAT CG4 LACTIC ACID, ED: Lactic Acid, Venous: 0.7 mmol/L (ref 0.5–1.9)

## 2023-06-27 NOTE — ED Triage Notes (Signed)
Pt came in via POV d/t her Rt thumb starting to swell, tingle, hurt & turn red. She denies any injuries to it & her manicure is 74 weeks old so she does not think that could have been it. Reports she was seen at [facility] & they drained around the nail bed & gave her IV ABT d/t streaks stating to show up her arm. Here for re-eval d/t continued pain/infection. A/Ox4, rates pain 4/10 while in triage.

## 2023-06-27 NOTE — ED Notes (Signed)
Pt leaves AMA.

## 2023-06-30 DIAGNOSIS — L03011 Cellulitis of right finger: Secondary | ICD-10-CM | POA: Diagnosis not present

## 2023-07-14 DIAGNOSIS — D0591 Unspecified type of carcinoma in situ of right breast: Secondary | ICD-10-CM | POA: Diagnosis not present

## 2023-07-14 DIAGNOSIS — D0512 Intraductal carcinoma in situ of left breast: Secondary | ICD-10-CM | POA: Diagnosis not present

## 2023-07-14 DIAGNOSIS — R928 Other abnormal and inconclusive findings on diagnostic imaging of breast: Secondary | ICD-10-CM | POA: Diagnosis not present

## 2023-08-30 DIAGNOSIS — M720 Palmar fascial fibromatosis [Dupuytren]: Secondary | ICD-10-CM | POA: Diagnosis not present

## 2023-08-30 DIAGNOSIS — L03011 Cellulitis of right finger: Secondary | ICD-10-CM | POA: Diagnosis not present

## 2023-10-19 ENCOUNTER — Other Ambulatory Visit: Payer: Self-pay

## 2023-10-19 DIAGNOSIS — M720 Palmar fascial fibromatosis [Dupuytren]: Secondary | ICD-10-CM | POA: Diagnosis not present

## 2023-10-20 LAB — SURGICAL PATHOLOGY

## 2023-10-28 DIAGNOSIS — M25642 Stiffness of left hand, not elsewhere classified: Secondary | ICD-10-CM | POA: Diagnosis not present

## 2023-10-28 DIAGNOSIS — M720 Palmar fascial fibromatosis [Dupuytren]: Secondary | ICD-10-CM | POA: Diagnosis not present

## 2023-11-03 DIAGNOSIS — M25642 Stiffness of left hand, not elsewhere classified: Secondary | ICD-10-CM | POA: Diagnosis not present

## 2023-11-03 DIAGNOSIS — M720 Palmar fascial fibromatosis [Dupuytren]: Secondary | ICD-10-CM | POA: Diagnosis not present

## 2023-11-07 ENCOUNTER — Ambulatory Visit
Admission: RE | Admit: 2023-11-07 | Discharge: 2023-11-07 | Disposition: A | Discharge status: Home / Self Care | Payer: Medicare Other | Source: Ambulatory Visit | Attending: Family Medicine | Admitting: Family Medicine

## 2023-11-07 DIAGNOSIS — M8588 Other specified disorders of bone density and structure, other site: Secondary | ICD-10-CM | POA: Diagnosis not present

## 2023-11-07 DIAGNOSIS — E2839 Other primary ovarian failure: Secondary | ICD-10-CM

## 2023-11-08 DIAGNOSIS — M25642 Stiffness of left hand, not elsewhere classified: Secondary | ICD-10-CM | POA: Diagnosis not present

## 2023-11-08 DIAGNOSIS — M720 Palmar fascial fibromatosis [Dupuytren]: Secondary | ICD-10-CM | POA: Diagnosis not present

## 2023-11-10 DIAGNOSIS — M25642 Stiffness of left hand, not elsewhere classified: Secondary | ICD-10-CM | POA: Diagnosis not present

## 2023-11-10 DIAGNOSIS — M720 Palmar fascial fibromatosis [Dupuytren]: Secondary | ICD-10-CM | POA: Diagnosis not present

## 2023-11-15 DIAGNOSIS — M720 Palmar fascial fibromatosis [Dupuytren]: Secondary | ICD-10-CM | POA: Diagnosis not present

## 2023-11-15 DIAGNOSIS — M25642 Stiffness of left hand, not elsewhere classified: Secondary | ICD-10-CM | POA: Diagnosis not present

## 2023-11-17 DIAGNOSIS — M720 Palmar fascial fibromatosis [Dupuytren]: Secondary | ICD-10-CM | POA: Diagnosis not present

## 2023-11-17 DIAGNOSIS — M25642 Stiffness of left hand, not elsewhere classified: Secondary | ICD-10-CM | POA: Diagnosis not present

## 2023-11-22 DIAGNOSIS — M720 Palmar fascial fibromatosis [Dupuytren]: Secondary | ICD-10-CM | POA: Diagnosis not present

## 2023-11-22 DIAGNOSIS — M25642 Stiffness of left hand, not elsewhere classified: Secondary | ICD-10-CM | POA: Diagnosis not present

## 2023-11-24 DIAGNOSIS — M25642 Stiffness of left hand, not elsewhere classified: Secondary | ICD-10-CM | POA: Diagnosis not present

## 2023-11-24 DIAGNOSIS — M720 Palmar fascial fibromatosis [Dupuytren]: Secondary | ICD-10-CM | POA: Diagnosis not present

## 2023-12-06 DIAGNOSIS — M720 Palmar fascial fibromatosis [Dupuytren]: Secondary | ICD-10-CM | POA: Diagnosis not present

## 2023-12-06 DIAGNOSIS — M25642 Stiffness of left hand, not elsewhere classified: Secondary | ICD-10-CM | POA: Diagnosis not present

## 2023-12-09 DIAGNOSIS — M25642 Stiffness of left hand, not elsewhere classified: Secondary | ICD-10-CM | POA: Diagnosis not present

## 2023-12-09 DIAGNOSIS — M720 Palmar fascial fibromatosis [Dupuytren]: Secondary | ICD-10-CM | POA: Diagnosis not present

## 2023-12-12 DIAGNOSIS — M720 Palmar fascial fibromatosis [Dupuytren]: Secondary | ICD-10-CM | POA: Diagnosis not present

## 2023-12-12 DIAGNOSIS — M25642 Stiffness of left hand, not elsewhere classified: Secondary | ICD-10-CM | POA: Diagnosis not present

## 2023-12-14 DIAGNOSIS — M720 Palmar fascial fibromatosis [Dupuytren]: Secondary | ICD-10-CM | POA: Diagnosis not present

## 2023-12-14 DIAGNOSIS — M25642 Stiffness of left hand, not elsewhere classified: Secondary | ICD-10-CM | POA: Diagnosis not present

## 2023-12-29 DIAGNOSIS — M720 Palmar fascial fibromatosis [Dupuytren]: Secondary | ICD-10-CM | POA: Diagnosis not present

## 2023-12-29 DIAGNOSIS — M25642 Stiffness of left hand, not elsewhere classified: Secondary | ICD-10-CM | POA: Diagnosis not present

## 2024-01-09 DIAGNOSIS — M720 Palmar fascial fibromatosis [Dupuytren]: Secondary | ICD-10-CM | POA: Diagnosis not present

## 2024-01-09 DIAGNOSIS — M25642 Stiffness of left hand, not elsewhere classified: Secondary | ICD-10-CM | POA: Diagnosis not present

## 2024-01-16 DIAGNOSIS — Z1239 Encounter for other screening for malignant neoplasm of breast: Secondary | ICD-10-CM | POA: Diagnosis not present

## 2024-01-16 DIAGNOSIS — D0512 Intraductal carcinoma in situ of left breast: Secondary | ICD-10-CM | POA: Diagnosis not present

## 2024-01-19 DIAGNOSIS — D0512 Intraductal carcinoma in situ of left breast: Secondary | ICD-10-CM | POA: Diagnosis not present

## 2024-01-24 DIAGNOSIS — M720 Palmar fascial fibromatosis [Dupuytren]: Secondary | ICD-10-CM | POA: Diagnosis not present

## 2024-01-31 DIAGNOSIS — Z6823 Body mass index (BMI) 23.0-23.9, adult: Secondary | ICD-10-CM | POA: Diagnosis not present

## 2024-03-13 DIAGNOSIS — C50912 Malignant neoplasm of unspecified site of left female breast: Secondary | ICD-10-CM | POA: Diagnosis not present

## 2024-03-13 DIAGNOSIS — E7800 Pure hypercholesterolemia, unspecified: Secondary | ICD-10-CM | POA: Diagnosis not present

## 2024-03-13 DIAGNOSIS — R0789 Other chest pain: Secondary | ICD-10-CM | POA: Diagnosis not present

## 2024-03-13 DIAGNOSIS — E559 Vitamin D deficiency, unspecified: Secondary | ICD-10-CM | POA: Diagnosis not present

## 2024-03-13 DIAGNOSIS — K219 Gastro-esophageal reflux disease without esophagitis: Secondary | ICD-10-CM | POA: Diagnosis not present

## 2024-03-13 DIAGNOSIS — Z0000 Encounter for general adult medical examination without abnormal findings: Secondary | ICD-10-CM | POA: Diagnosis not present

## 2024-03-13 DIAGNOSIS — M8588 Other specified disorders of bone density and structure, other site: Secondary | ICD-10-CM | POA: Diagnosis not present

## 2024-03-23 ENCOUNTER — Other Ambulatory Visit (HOSPITAL_BASED_OUTPATIENT_CLINIC_OR_DEPARTMENT_OTHER): Payer: Self-pay | Admitting: Family Medicine

## 2024-03-23 DIAGNOSIS — E7800 Pure hypercholesterolemia, unspecified: Secondary | ICD-10-CM

## 2024-04-11 DIAGNOSIS — Z23 Encounter for immunization: Secondary | ICD-10-CM | POA: Diagnosis not present

## 2024-04-12 DIAGNOSIS — H524 Presbyopia: Secondary | ICD-10-CM | POA: Diagnosis not present

## 2024-04-12 DIAGNOSIS — H04123 Dry eye syndrome of bilateral lacrimal glands: Secondary | ICD-10-CM | POA: Diagnosis not present

## 2024-04-12 DIAGNOSIS — H43393 Other vitreous opacities, bilateral: Secondary | ICD-10-CM | POA: Diagnosis not present

## 2024-04-12 DIAGNOSIS — H2513 Age-related nuclear cataract, bilateral: Secondary | ICD-10-CM | POA: Diagnosis not present

## 2024-04-12 DIAGNOSIS — H1789 Other corneal scars and opacities: Secondary | ICD-10-CM | POA: Diagnosis not present

## 2024-04-12 DIAGNOSIS — H5213 Myopia, bilateral: Secondary | ICD-10-CM | POA: Diagnosis not present

## 2024-04-12 DIAGNOSIS — H52203 Unspecified astigmatism, bilateral: Secondary | ICD-10-CM | POA: Diagnosis not present

## 2024-04-12 DIAGNOSIS — H18452 Nodular corneal degeneration, left eye: Secondary | ICD-10-CM | POA: Diagnosis not present

## 2024-05-07 ENCOUNTER — Ambulatory Visit (HOSPITAL_BASED_OUTPATIENT_CLINIC_OR_DEPARTMENT_OTHER)
Admission: RE | Admit: 2024-05-07 | Discharge: 2024-05-07 | Disposition: A | Discharge status: Home / Self Care | Payer: Self-pay | Source: Ambulatory Visit | Attending: Family Medicine | Admitting: Family Medicine

## 2024-05-07 DIAGNOSIS — E7800 Pure hypercholesterolemia, unspecified: Secondary | ICD-10-CM | POA: Insufficient documentation

## 2024-07-24 DIAGNOSIS — D0512 Intraductal carcinoma in situ of left breast: Secondary | ICD-10-CM | POA: Diagnosis not present

## 2024-07-24 DIAGNOSIS — R92333 Mammographic heterogeneous density, bilateral breasts: Secondary | ICD-10-CM | POA: Diagnosis not present

## 2024-07-24 DIAGNOSIS — Z853 Personal history of malignant neoplasm of breast: Secondary | ICD-10-CM | POA: Diagnosis not present

## 2024-07-24 DIAGNOSIS — R928 Other abnormal and inconclusive findings on diagnostic imaging of breast: Secondary | ICD-10-CM | POA: Diagnosis not present

## 2024-07-24 DIAGNOSIS — Z1231 Encounter for screening mammogram for malignant neoplasm of breast: Secondary | ICD-10-CM | POA: Diagnosis not present
